# Patient Record
Sex: Male | Born: 1975 | Race: White | Hispanic: No | Marital: Single
Health system: Southern US, Community
[De-identification: ages and names within clinical notes are randomized; demographics above are authoritative.]

---

## 1998-01-01 ENCOUNTER — Emergency Department (HOSPITAL_COMMUNITY): Admission: EM | Admit: 1998-01-01 | Discharge: 1998-01-01 | Payer: Self-pay | Admitting: Emergency Medicine

## 2020-07-24 ENCOUNTER — Other Ambulatory Visit: Payer: Self-pay | Admitting: Family Medicine

## 2020-07-24 DIAGNOSIS — M5459 Other low back pain: Secondary | ICD-10-CM

## 2020-08-01 ENCOUNTER — Ambulatory Visit
Admission: RE | Admit: 2020-08-01 | Discharge: 2020-08-01 | Disposition: A | Discharge status: Home / Self Care | Payer: No Typology Code available for payment source | Source: Ambulatory Visit | Attending: Family Medicine | Admitting: Family Medicine

## 2020-08-01 ENCOUNTER — Other Ambulatory Visit: Payer: Self-pay

## 2020-08-01 DIAGNOSIS — M5459 Other low back pain: Secondary | ICD-10-CM | POA: Insufficient documentation

## 2020-09-14 ENCOUNTER — Encounter: Payer: Self-pay | Admitting: Physical Therapy

## 2020-09-14 ENCOUNTER — Other Ambulatory Visit: Payer: Self-pay

## 2020-09-14 ENCOUNTER — Ambulatory Visit: Payer: No Typology Code available for payment source | Attending: Neurosurgery | Admitting: Physical Therapy

## 2020-09-14 DIAGNOSIS — M542 Cervicalgia: Secondary | ICD-10-CM | POA: Diagnosis not present

## 2020-09-14 DIAGNOSIS — R293 Abnormal posture: Secondary | ICD-10-CM | POA: Insufficient documentation

## 2020-09-14 DIAGNOSIS — M62838 Other muscle spasm: Secondary | ICD-10-CM | POA: Insufficient documentation

## 2020-09-14 NOTE — Therapy (Addendum)
[facility] Outpatient Rehabilitation [facility] [address] [place], [place], [address] Phone: [phone]   Fax:  [phone]  Physical Therapy Evaluation  Patient Details  Name: Trevor Monroe MRN: [id number] Date of Birth: [birth year] Referring Provider (PT): Dr Barbaraann Barthel   Encounter Date: 09/14/2020   PT End of Session - 09/14/20 1640    Visit Number 1    Number of Visits 16    Authorization Type 15 visits + eval approved    PT Start Time 1145    PT Stop Time 1237    PT Time Calculation (min) 52 min    Activity Tolerance Patient tolerated treatment well    Behavior During Therapy [facility] for tasks assessed/performed           History reviewed. No pertinent past medical history.  History reviewed. No pertinent surgical history.  There were no vitals filed for this visit.    Subjective Assessment - 09/14/20 1211    Subjective For the past 8 months the patient has had progressive neck pain. The pain can last a few days and when it is hurting the pain can be severe. 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 His lowq back was injured in the [employer].    Pertinent History Per patient L4 disc buldge    Limitations Standing;Walking    Currently in Pain? Yes    Pain Score 8     Pain Location Neck    Pain Orientation Right;Left    Pain Descriptors / Indicators Aching    Pain Type Chronic pain    Pain Radiating Towards radiates into right arm but has been hapening less    Pain Onset More than a month ago    Pain Frequency Intermittent    Aggravating Factors  turning his head, sleeping, work    Pain Relieving Factors nothing when it is hurting    Effect of Pain on Daily Activities difficulty perfroming daily activity when his neck is hurting    Multiple Pain Sites No              [facility] PT Assessment - 09/14/20 0001      Assessment   Medical Diagnosis Cervicalgia    Referring Provider (PT)  Dr Barbaraann Barthel    Onset Date/Surgical Date --   >8 months   Hand Dominance Right    Next MD Visit 3-4 weeks    Prior Therapy Has been to a chiropracter and is recieving acupunture      Precautions   Precautions None      Restrictions   Weight Bearing Restrictions No      Balance Screen   Has the patient fallen in the past 6 months No    Has the patient had a decrease in activity level because of a fear of falling?  No    Is the patient reluctant to leave their home because of a fear of falling?  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 60% disability 2nd to lumbar buldging disc    Focus on Therapeutic Outcomes (FOTO)  49% ability 61 % expected      Sensation   Light Touch Appears Intact      Coordination   Gross Motor Movements are Fluid and Coordinated Yes    Fine Motor Movements are Fluid and Coordinated Yes      Posture/Postural Control   Posture Comments forward head      ROM / Strength   AROM / PROM / Strength PROM;AROM;Strength      AROM   Overall AROM Comments full active ROM of both shoulders    AROM Assessment Site Cervical    Cervical Flexion 38   with pain   Cervical Extension full range with mild pain    Cervical - Right Rotation 40    Cervical - Left Rotation 55      Strength   Overall Strength Comments right grip 115 left 120 all other UE strength 5/5      Palpation   Palpation comment Spasming of bilateral paraspinals R> LK                      Objective measurements completed on examination: See above  findings.       [facility] Adult PT Treatment/Exercise - 09/14/20 0001      Exercises   Exercises Neck      Neck Exercises: Standing   Other Standing Exercises scap retraction 2x10 green      Manual Therapy   Manual Therapy Myofascial release;Soft tissue mobilization;Joint mobilization;Manual Traction    Soft tissue mobilization to upper trap and cerivcal paraspinals    Manual Traction to cervical spine      Neck Exercises: Stretches   Upper Trapezius Stretch 2 reps;20 seconds    Upper Trapezius Stretch Limitations left only    Levator Stretch 2 reps;20 seconds    Levator Stretch Limitations left only                  PT Education - 09/14/20 1640    Education Details improtance of posture; decreasing muslce spasms    Person(s) Educated Patient    Methods Explanation;Demonstration;Tactile cues;Verbal cues    Comprehension Verbalized understanding;Returned demonstration;Verbal cues required;Tactile cues required;Need further instruction               PT SHORT TERM GOAL #1   Title Patient will report a 50% reduction in radicular pain    Time 3    Period Weeks    Status New    Target Date 10/22/20        PT SHORT TERM GOAL #2   Title Patient will demonstrate full cerivcal motion without pain    Time 3    Period Weeks    Status New    Target Date 10/22/20        PT SHORT TERM GOAL #3   Title Patient will report a good understanding of proper posture when perfroming work tasks    Time 3    Period Weeks    Status New    Target Date 10/22/20                 PT Long Term Goals - 10/01/20 0828         PT LONG TERM GOAL #1    Title Patient will perfrom all work tasks without increased  pain or radicular symptoms     Time 6     Period Weeks     Status New     Target Date 11/12/20          PT LONG TERM GOAL #2    Title Patient will return to working out without increased pain     Time 6     Period Weeks     Status  New     Target Date 11/12/20                    Plan - 09/14/20 1357    Clinical Impression Statement Patient is a 45 year old male with cervicalgia/ occasional right radiculopathy. He has limited movement with rotation R>L and pain with cervical flexion. Fro his job he has to look down and use a computer during the day. He has significant spasming in bilateral upper traps and into his neck.    Personal Factors and Comorbidities Comorbidity 1;Profession    Comorbidities lumbar bulding disc.    Examination-Activity Limitations Sleep;Lift;Carry    Examination-Participation Restrictions Cleaning;Meal Prep;Occupation;Laundry    Stability/Clinical Decision Making Evolving/Moderate complexity   increasing pain levels over the past 8 months   Clinical Decision Making Moderate    Rehab Potential Excellent    PT Frequency 2x / week    PT Duration 6 weeks    PT Treatment/Interventions ADLs/Self Care Home Management;Electrical Stimulation;Iontophoresis 4mg /ml Dexamethasone;Moist Heat;Ultrasound;Therapeutic activities;Therapeutic exercise;Neuromuscular re-education;Manual techniques;Patient/family education;Passive range of motion;Taping    PT Next Visit Plan continue with manual traction; dry needling; monitor right rotation; add shoulder extnesion; consider bilateral ER; manual therapy to thoriacic spine if indicated.    PT Home Exercise Plan upper trap stretch; levatro stretch; row; cervical rotation 3x 3x a day    Consulted and Agree with Plan of Care Patient           Patient will benefit from skilled therapeutic intervention in order to improve the following deficits and impairments:  Increased fascial restricitons,Decreased endurance,Pain,Decreased mobility,Decreased strength,Improper body mechanics,Decreased activity tolerance,Increased muscle spasms  Visit Diagnosis: Cervicalgia  Abnormal posture  Other muscle spasm     Problem List There are no problems to display for  this patient.    PT DPT  09/14/2020, 4:48 PM  [facility] [address] [place], [place], [place] Phone: [phone]   Fax:  [phone]  Name: Trevor Monroe MRN: [name] Date of Birth: July 02, 1975

## 2020-09-28 ENCOUNTER — Other Ambulatory Visit: Payer: Self-pay

## 2020-09-28 ENCOUNTER — Encounter: Payer: Self-pay | Admitting: Physical Therapy

## 2020-09-28 ENCOUNTER — Ambulatory Visit: Payer: No Typology Code available for payment source | Admitting: Physical Therapy

## 2020-09-28 DIAGNOSIS — R293 Abnormal posture: Secondary | ICD-10-CM

## 2020-09-28 DIAGNOSIS — M542 Cervicalgia: Secondary | ICD-10-CM

## 2020-09-28 DIAGNOSIS — M62838 Other muscle spasm: Secondary | ICD-10-CM

## 2020-09-28 NOTE — Therapy (Signed)
[facility] Outpatient Rehabilitation [facility] [address] [place], [place], [address] Phone: [phone]   Fax:  [phone]  Physical Therapy Treatment  Patient Details  Name: Trevor Monroe MRN: [id number] Date of Birth: 1976/02/10 Referring Provider (PT): Dr Barbaraann Barthel   Encounter Date: 09/28/2020   PT End of Session - 09/28/20 1017    Visit Number 2    Number of Visits 16    Date for PT Re-Evaluation 11/02/20    Authorization Type 15 visits + eval approved    Authorization Time Period 08/11/2020 - 02/07/2021    Authorization - Visit Number 2    Authorization - Number of Visits 16    PT Start Time 1017    PT Stop Time 1058    PT Time Calculation (min) 41 min    Activity Tolerance Patient tolerated treatment well    Behavior During Therapy [facility] for tasks assessed/performed           History reviewed. No pertinent past medical history.  History reviewed. No pertinent surgical history.  There were no vitals filed for this visit.   Subjective Assessment - 09/28/20 1018    Subjective "I am doing pretty good since the last session. I have been doing my exercsises and I am still doing accupuncture all over. I am have more L flank pain and still having issues with HA."    Currently in Pain? Yes    Pain Score 4     Pain Location Neck              [facility] PT Assessment - 09/28/20 0001      Assessment   Medical Diagnosis Cervicalgia                         [facility] Adult PT Treatment/Exercise - 09/28/20 0001      Neck Exercises: Theraband   Other Theraband Exercises seated lower trap strenthening 2 x 15 with red theraband with elbows propped on bolster    Other Theraband Exercises 1 x 20 double ER with scapular retraction 1 x 20      Manual Therapy   Manual Therapy Myofascial release    Manual therapy comments skilled palpation and monitoring of pt throughout TPDN    Soft tissue mobilization IASTM along R upper trap / cervical  multidif    Myofascial Release fascial rolling      Neck Exercises: Stretches   Upper Trapezius Stretch 1 rep;Right;30 seconds    Levator Stretch 1 rep;30 seconds            Trigger Point Dry Needling - 09/28/20 0001    Consent Given? Yes    Education Handout Provided Previously provided    Muscles Treated Head and Neck Cervical multifidi;Upper trapezius    Upper Trapezius Response Twitch reponse elicited;Palpable increased muscle length   bil   Cervical multifidi Response Twitch reponse elicited;Palpable increased muscle length   bil                           Plan - 09/28/20 1106    Clinical Impression Statement pt arrives noting significant reduction in neck pain. He has been compliant with his HEP and has been going to an accupunturist. reviewed TPDN and consent was provided for TPDN focusing on bil upper trap, cervical multifidi followed with IASTM techniques. continued working posterior shoulder strengthening which he responded well to. trialed upper trap inhibition taping  which he responded well to.    PT Treatment/Interventions ADLs/Self Care Home Management;Electrical Stimulation;Iontophoresis 4mg /ml Dexamethasone;Moist Heat;Ultrasound;Therapeutic activities;Therapeutic exercise;Neuromuscular re-education;Manual techniques;Patient/family education;Passive range of motion;Taping    PT Next Visit Plan continue with manual traction; response dry needling; monitor right rotation; add shoulder extnesion; consider bilateral ER; manual therapy to thoriacic spine if indicated.    PT Home Exercise Plan upper trap stretch; levatro stretch; row; cervical rotation 3x 3x a day    Consulted and Agree with Plan of Care Patient           Patient will benefit from skilled therapeutic intervention in order to improve the following deficits and impairments:  Increased fascial restricitons,Decreased endurance,Pain,Decreased mobility,Decreased strength,Improper body  mechanics,Decreased activity tolerance,Increased muscle spasms  Visit Diagnosis: Cervicalgia  Abnormal posture  Other muscle spasm     Problem List There are no problems to display for this patient.   PT, DPT, LAT, ATC  09/28/20  11:09 AM      [facility] [address] [place], [place], [place] Phone: [phone]   Fax:  [phone]  Name: Trevor Monroe MRN: [name] Date of Birth: [birth year]

## 2020-09-30 ENCOUNTER — Ambulatory Visit: Payer: No Typology Code available for payment source | Admitting: Physical Therapy

## 2020-09-30 ENCOUNTER — Other Ambulatory Visit: Payer: Self-pay

## 2020-09-30 ENCOUNTER — Encounter: Payer: Self-pay | Admitting: Physical Therapy

## 2020-09-30 DIAGNOSIS — M542 Cervicalgia: Secondary | ICD-10-CM

## 2020-09-30 DIAGNOSIS — M62838 Other muscle spasm: Secondary | ICD-10-CM

## 2020-09-30 DIAGNOSIS — R293 Abnormal posture: Secondary | ICD-10-CM

## 2020-10-01 ENCOUNTER — Encounter: Payer: Self-pay | Admitting: Physical Therapy

## 2020-10-01 NOTE — Therapy (Signed)
[facility] Out[facility] Rehabilitation [facility] [address] [place], [place], [address] Phone: [phone]   Fax:  [phone]  Physical Therapy Treatment  Patient Details  Name: Trevor Monroe MRN: [id number] Date of Birth: 04-22-1976 Referring Provider (PT): Dr Barbaraann Barthel   Encounter Date: 09/30/2020   PT End of Session - 10/01/20 0825    Visit Number 3    Number of Visits 16    Date for PT Re-Evaluation 11/02/20    Authorization Type 15 visits + eval approved    Authorization Time Period 08/11/2020 - 02/07/2021    Authorization - Visit Number 3    Authorization - Number of Visits 16    PT Start Time 1016    PT Stop Time 1059    PT Time Calculation (min) 43 min    Activity Tolerance Patient tolerated treatment well    Behavior During Therapy [facility] for tasks assessed/performed           History reviewed. No pertinent past medical history.  History reviewed. No pertinent surgical history.  There were no vitals filed for this visit.   Subjective Assessment - 10/01/20 0823    Subjective Patient reports his neck pain is improving. he is stil having mild radicular symptoms into his right deltoid area at times. He also continues to have mild left saided low back pain. He has been working on his exercises. he reported the needling went well.    Pertinent History Per patient L4 disc buldge    Limitations Standing;Walking    Currently in Pain? Yes    Pain Score 4     Pain Location Neck    Pain Orientation Right;Left    Pain Descriptors / Indicators Aching    Pain Type Chronic pain    Pain Onset More than a month ago    Pain Frequency Intermittent    Aggravating Factors  turning his head    Pain Relieving Factors ntohing wehn it is hurting    Effect of Pain on Daily Activities difficulty perfroming daily activity    Multiple Pain Sites No              [facility] PT Assessment - 10/01/20 0001      Assessment   Referring Provider (PT) Dr Barbaraann Barthel                          [facility] Adult PT Treatment/Exercise - 10/01/20 0001      Neck Exercises: Theraband   Other Theraband Exercises seated lower trap strenthening 2 x 15 with red theraband with elbows propped on bolster    Other Theraband Exercises 1 x 20 double ER with scapular retraction 1 x 20      Neck Exercises: Standing   Other Standing Exercises scap retraction 2x10 blue ; shoulder extnesion blue      Manual Therapy   Manual Therapy Myofascial release    Manual therapy comments skilled palpation and monitoring of pt throughout TPDN    Soft tissue mobilization IASTM along R upper trap / cervical multidif    Manual Traction to cervical spine      Neck Exercises: Stretches   Upper Trapezius Stretch 1 rep;Right;30 seconds    Levator Stretch 1 rep;30 seconds                  PT Education - 10/01/20 0824    Education Details HEP and symptom mangement    Person(s) Educated Patient  Methods Explanation;Demonstration;Tactile cues;Verbal cues    Comprehension Verbalized understanding;Returned demonstration;Verbal cues required;Tactile cues required            PT Short Term Goals - 10/01/20 0827      PT SHORT TERM GOAL #1   Title Patient will report a 50% reduction in radicular pain    Time 3    Period Weeks    Status New    Target Date 10/22/20      PT SHORT TERM GOAL #2   Title Patient will demonstrate full cerivcal motion without pain    Time 3    Period Weeks    Status New    Target Date 10/22/20      PT SHORT TERM GOAL #3   Title Patient will report a good understanding of proper posture when perfroming work tasks    Time 3    Period Weeks    Status New    Target Date 10/22/20             PT Long Term Goals - 10/01/20 0828      PT LONG TERM GOAL #1   Title Patient will perfrom all work tasks without increased pain or radicular symptoms    Time 6    Period Weeks    Status New    Target Date 11/12/20      PT LONG TERM GOAL #2    Title Patient will return to working out without increased pain    Time 6    Period Weeks    Status New    Target Date 11/12/20                 Plan - 10/01/20 0825    Clinical Impression Statement Patient is making good progress. therapy reviewed HEP and perfromed manual therapy to the cervicalspine. he had no increase in pain with treatment. We will continue with current plan. Plan to needle next visit. Patient advised to continue to be aware of his posture.    Personal Factors and Comorbidities Comorbidity 1;Profession    Comorbidities lumbar bulding disc.    Examination-Activity Limitations Sleep;Lift;Carry    Examination-Participation Restrictions Cleaning;Meal Prep;Occupation;Laundry    Stability/Clinical Decision Making Evolving/Moderate complexity    Clinical Decision Making Moderate    Rehab Potential Excellent    PT Frequency 2x / week    PT Duration 6 weeks    PT Treatment/Interventions ADLs/Self Care Home Management;Electrical Stimulation;Iontophoresis 4mg /ml Dexamethasone;Moist Heat;Ultrasound;Therapeutic activities;Therapeutic exercise;Neuromuscular re-education;Manual techniques;Patient/family education;Passive range of motion;Taping    PT Next Visit Plan continue with manual traction; response dry needling; monitor right rotation; add shoulder extnesion; consider bilateral ER; manual therapy to thoriacic spine if indicated.    PT Home Exercise Plan upper trap stretch; levatro stretch; row; cervical rotation 3x 3x a day    Consulted and Agree with Plan of Care Patient           Patient will benefit from skilled therapeutic intervention in order to improve the following deficits and impairments:  Increased fascial restricitons,Decreased endurance,Pain,Decreased mobility,Decreased strength,Improper body mechanics,Decreased activity tolerance,Increased muscle spasms  Visit Diagnosis: Cervicalgia  Abnormal posture  Other muscle spasm     Problem List There  are no problems to display for this patient.   PT DPT  10/01/2020, 8:40 AM  [facility] [address] [place], [place], [place] Phone: [phone]   Fax:  [phone]  Name: Trevor Monroe MRN: [name] Date of Birth: January 24, 1976

## 2020-10-01 NOTE — Addendum Note (Signed)
Addended by: Dessie Coma on: 10/01/2020 08:54 AM   Modules accepted: Orders

## 2020-10-07 ENCOUNTER — Encounter: Payer: Self-pay | Admitting: Physical Therapy

## 2020-10-07 ENCOUNTER — Other Ambulatory Visit: Payer: Self-pay

## 2020-10-07 ENCOUNTER — Ambulatory Visit: Payer: No Typology Code available for payment source | Admitting: Physical Therapy

## 2020-10-07 DIAGNOSIS — M62838 Other muscle spasm: Secondary | ICD-10-CM

## 2020-10-07 DIAGNOSIS — M542 Cervicalgia: Secondary | ICD-10-CM | POA: Diagnosis not present

## 2020-10-07 DIAGNOSIS — R293 Abnormal posture: Secondary | ICD-10-CM

## 2020-10-08 NOTE — Therapy (Signed)
[facility] Outpatient Rehabilitation [facility] [address] [place], [place], [address] Phone: [phone]   Fax:  [phone]  Physical Therapy Treatment  Patient Details  Name: Trevor Monroe MRN: [id number] Date of Birth: 10-02-1975 Referring Provider (PT): Dr Barbaraann Barthel   Encounter Date: 10/07/2020   PT End of Session - 10/07/20 0848    Visit Number 4    Number of Visits 16    Date for PT Re-Evaluation 11/02/20    Authorization Type 15 visits + eval approved    Authorization Time Period 08/11/2020 - 02/07/2021    Authorization - Visit Number 4    Authorization - Number of Visits 16    PT Start Time 0848    PT Stop Time 0928    PT Time Calculation (min) 40 min    Activity Tolerance Patient tolerated treatment well    Behavior During Therapy [facility] for tasks assessed/performed           History reviewed. No pertinent past medical history.  History reviewed. No pertinent surgical history.  There were no vitals filed for this visit.   Subjective Assessment - 10/07/20 0849    Subjective "I think things are getting better, I am feeling pretty good today. I had a follow up with the spine MD the other day and he ordered a MRI for my lower spine."    Currently in Pain? No/denies    Pain Score 0-No pain    Aggravating Factors  unsure                             [facility] Adult PT Treatment/Exercise - 10/07/20 0001      Exercises   Exercises Shoulder      Neck Exercises: Supine   Neck Retraction 5 reps;10 secs    Capital Flexion 10 secs    Capital Flexion Limitations chin tuck with head lift      Shoulder Exercises: Supine   Theraband Level (Shoulder Horizontal ABduction) Level 3 (Green)    Horizontal ABduction Limitations 2x10    Other Supine Exercises D2 pattern with GTB 2x10 ea      Shoulder Exercises: Prone   Horizontal ABduction 2 10 reps;Strengthening   on physioball shoulder 90-90  w/ chin tuck 5/ holds 2 sets     Shoulder  Exercises: Standing   External Rotation 15 reps;Strengthening;Both   3 sets green TB   Other Standing Exercises Self held Tricep ext   Green TB  20x ea     Shoulder Exercises: ROM/Strengthening   UBE (Upper Arm Bike) level 5 2' fwd/bkwd                  PT Education - 10/07/20 0950    Education Details Activity modification to avoid aggs    Person(s) Educated Patient    Methods Explanation;Verbal cues    Comprehension Verbalized understanding            PT Short Term Goals - 10/01/20 0827      PT SHORT TERM GOAL #1   Title Patient will report a 50% reduction in radicular pain    Time 3    Period Weeks    Status New    Target Date 10/22/20      PT SHORT TERM GOAL #2   Title Patient will demonstrate full cerivcal motion without pain    Time 3    Period Weeks    Status New  Target Date 10/22/20      PT SHORT TERM GOAL #3   Title Patient will report a good understanding of proper posture when perfroming work tasks    Time 3    Period Weeks    Status New    Target Date 10/22/20             PT Long Term Goals - 10/01/20 0828      PT LONG TERM GOAL #1   Title Patient will perfrom all work tasks without increased pain or radicular symptoms    Time 6    Period Weeks    Status New    Target Date 11/12/20      PT LONG TERM GOAL #2   Title Patient will return to working out without increased pain    Time 6    Period Weeks    Status New    Target Date 11/12/20                 Plan - 10/07/20 0944    Clinical Impression Statement Pt is progressing well with therapy.  He is still having some intermittent pain but feels he is improving overall.  Pt advised to take breaks during strenuous activity at work; he confirms understanding.  Pt tolerated therex well with fatigue but no increase in sxs.    PT Treatment/Interventions ADLs/Self Care Home Management;Electrical Stimulation;Iontophoresis 4mg /ml Dexamethasone;Moist Heat;Ultrasound;Therapeutic  activities;Therapeutic exercise;Neuromuscular re-education;Manual techniques;Patient/family education;Passive range of motion;Taping    PT Next Visit Plan continue with manual traction; response dry needling; monitor right rotation; add shoulder extnesion; consider bilateral ER; manual therapy to thoriacic spine if indicated.    PT Home Exercise Plan upper trap stretch; levatro stretch; row; cervical rotation 3x 3x a day           Patient will benefit from skilled therapeutic intervention in order to improve the following deficits and impairments:     Visit Diagnosis: No diagnosis found.     Problem List There are no problems to display for this patient.   PT, DPT, LAT, ATC  10/08/20  7:54 AM      [facility] [address] [place], [place], [place] Phone: [phone]   Fax:  [phone]  Name: Trevor Monroe MRN: [name] Date of Birth: 09/12/1975

## 2020-10-14 ENCOUNTER — Other Ambulatory Visit: Payer: Self-pay

## 2020-10-14 ENCOUNTER — Ambulatory Visit: Payer: No Typology Code available for payment source | Attending: Neurosurgery | Admitting: Physical Therapy

## 2020-10-14 DIAGNOSIS — R293 Abnormal posture: Secondary | ICD-10-CM | POA: Diagnosis present

## 2020-10-14 DIAGNOSIS — M62838 Other muscle spasm: Secondary | ICD-10-CM | POA: Insufficient documentation

## 2020-10-14 DIAGNOSIS — M542 Cervicalgia: Secondary | ICD-10-CM | POA: Diagnosis not present

## 2020-10-14 NOTE — Therapy (Signed)
[facility] [address] [place], [place], [address] Phone: [phone]   Fax:  [phone]  Physical Therapy Treatment  Patient Details  Name: Trevor Monroe MRN: [id number] Date of Birth: Oct 28, 1975 Referring Provider (PT): Dr Barbaraann Barthel   Encounter Date: 10/14/2020   PT End of Session - 10/14/20 1048    Visit Number 5    Number of Visits 16    Date for PT Re-Evaluation 11/02/20    Authorization Type 15 visits + eval approved    Authorization Time Period 08/11/2020 - 02/07/2021    Authorization - Visit Number 4    Authorization - Number of Visits 16    PT Start Time 1045    PT Stop Time 1130    PT Time Calculation (min) 45 min    Activity Tolerance Patient tolerated treatment well    Behavior During Therapy [facility] for tasks assessed/performed           No past medical history on file.  No past surgical history on file.  There were no vitals filed for this visit.   Subjective Assessment - 10/14/20 1048    Subjective Pt reports that he has had some intermittent n/t in his fingers L>R (2x since last visit).  His neck has been doing well, although he endorses some LBP today.    Pertinent History Per patient L4 disc buldge    Currently in Pain? Yes    Pain Score 3     Pain Location Back                             [facility] Adult PT Treatment/Exercise - 10/14/20 0001      Neck Exercises: Machines for Strengthening   Cybex Row high row - 3x10 - 36#      Neck Exercises: Supine   Neck Retraction 5 reps;10 secs    Neck Retraction Limitations with manual OP, L/R/C for listed reps each    Capital Flexion 10 secs    Capital Flexion Limitations chin tuck with head lift      Shoulder Exercises: Supine   Theraband Level (Shoulder Horizontal ABduction) Level 4 (Blue)    Horizontal ABduction Limitations 3x10    Other Supine Exercises D2 pattern with GTB 2x10 ea    Other Supine Exercises Foam roller routine  incuding protraction/retraction, horizontal abduction, alternating flexion, and shoulder circles 5x ea      Shoulder Exercises: Prone   Other Prone Exercises Bird dog - 2x10 with 2 pound weights on wrists    Other Prone Exercises Scapular push up from knees - 2x10 with 3'' hold                    PT Short Term Goals - 10/01/20 0827      PT SHORT TERM GOAL #1   Title Patient will report a 50% reduction in radicular pain    Time 3    Period Weeks    Status New    Target Date 10/22/20      PT SHORT TERM GOAL #2   Title Patient will demonstrate full cerivcal motion without pain    Time 3    Period Weeks    Status New    Target Date 10/22/20      PT SHORT TERM GOAL #3   Title Patient will report a good understanding of proper posture when perfroming work tasks    Time  3    Period Weeks    Status New    Target Date 10/22/20             PT Long Term Goals - 10/01/20 0828      PT LONG TERM GOAL #1   Title Patient will perfrom all work tasks without increased pain or radicular symptoms    Time 6    Period Weeks    Status New    Target Date 11/12/20      PT LONG TERM GOAL #2   Title Patient will return to working out without increased pain    Time 6    Period Weeks    Status New    Target Date 11/12/20                 Plan - 10/14/20 1103    Clinical Impression Statement Pt is progressing well with therapy.  Able to progress to higher level periscapular strengthening today with fatigue but no increase in sxs.  Will integrate thoracic mobility followed by strengthening in furture visits.    PT Treatment/Interventions ADLs/Self Care Home Management;Electrical Stimulation;Iontophoresis 4mg /ml Dexamethasone;Moist Heat;Ultrasound;Therapeutic activities;Therapeutic exercise;Neuromuscular re-education;Manual techniques;Patient/family education;Passive range of motion;Taping    PT Next Visit Plan continue with manual traction; response dry needling; monitor  right rotation; add shoulder extnesion; consider bilateral ER; manual therapy to thoriacic spine if indicated.    PT Home Exercise Plan bilateral ER, D2 flexion, and foam roller           Patient will benefit from skilled therapeutic intervention in order to improve the following deficits and impairments:     Visit Diagnosis: Cervicalgia  Abnormal posture     Problem List There are no problems to display for this patient.   PT, DPT 10/14/20 11:33 AM  [facility] [address] [place], [place], [place] Phone: [phone]   Fax:  [phone]  Name: OSMEL DYKSTRA MRN: [name] Date of Birth: [birth year]

## 2020-10-21 ENCOUNTER — Encounter: Payer: Self-pay | Admitting: Physical Therapy

## 2020-10-21 ENCOUNTER — Other Ambulatory Visit: Payer: Self-pay

## 2020-10-21 ENCOUNTER — Ambulatory Visit: Payer: No Typology Code available for payment source | Admitting: Physical Therapy

## 2020-10-21 DIAGNOSIS — M542 Cervicalgia: Secondary | ICD-10-CM

## 2020-10-21 DIAGNOSIS — M62838 Other muscle spasm: Secondary | ICD-10-CM

## 2020-10-21 DIAGNOSIS — R293 Abnormal posture: Secondary | ICD-10-CM

## 2020-10-21 NOTE — Therapy (Signed)
[facility] [address] [place], [place], [address] Phone: [phone]   Fax:  [phone]  Physical Therapy Treatment  Patient Details  Name: Trevor Monroe MRN: [id number] Date of Birth: 1975-12-23 Referring Provider (PT): Dr Barbaraann Barthel   Encounter Date: 10/21/2020   PT End of Session - 10/21/20 1016    Visit Number 6    Number of Visits 16    Date for PT Re-Evaluation 11/02/20    Authorization Type 15 visits + eval approved    Authorization Time Period 08/11/2020 - 02/07/2021    Authorization - Number of Visits 16    PT Start Time 1015   pt arrived late   Activity Tolerance Patient tolerated treatment well    Behavior During Therapy [facility] for tasks assessed/performed           History reviewed. No pertinent past medical history.  History reviewed. No pertinent surgical history.  There were no vitals filed for this visit.   Subjective Assessment - 10/21/20 1017    Subjective Pt reports that his neck is doing well.  he has been consistent with his HEP.  He feels that HEP is still challenging.  He has had no flare ups since last visit.    Pertinent History Per patient L4 disc buldge    Pain Score 0-No pain                             [facility] Adult PT Treatment/Exercise - 10/21/20 0001      Neck Exercises: Machines for Strengthening   Cybex Row high row - 3x15 - 25#      Shoulder Exercises: Supine   Theraband Level (Shoulder Horizontal ABduction) Level 4 (Blue)    Horizontal ABduction Limitations 2x10 on foam roller    Other Supine Exercises D2 pattern with BTB 2x10 ea on foam roller    Other Supine Exercises Foam roller routine incuding protraction/retraction, horizontal abduction, alternating flexion, and shoulder circles, thoracic ext, and throacic rotation in quadruped 5x ea      Shoulder Exercises: Prone   External Rotation Limitations at 90-90 on physioball w/ 2#    Other Prone Exercises Bird  dog - 2x10 with 2 pound weights on wrists - 5'' hold                    PT Short Term Goals - 10/01/20 0827      PT SHORT TERM GOAL #1   Title Patient will report a 50% reduction in radicular pain    Time 3    Period Weeks    Status New    Target Date 10/22/20      PT SHORT TERM GOAL #2   Title Patient will demonstrate full cerivcal motion without pain    Time 3    Period Weeks    Status New    Target Date 10/22/20      PT SHORT TERM GOAL #3   Title Patient will report a good understanding of proper posture when perfroming work tasks    Time 3    Period Weeks    Status New    Target Date 10/22/20             PT Long Term Goals - 10/01/20 0828      PT LONG TERM GOAL #1   Title Patient will perfrom all work tasks without increased pain or radicular symptoms  Time 6    Period Weeks    Status New    Target Date 11/12/20      PT LONG TERM GOAL #2   Title Patient will return to working out without increased pain    Time 6    Period Weeks    Status New    Target Date 11/12/20                 Plan - 10/21/20 1025    Clinical Impression Statement Pt progressing as expected.  Will continue to progress cervical and periscapular strengthening combined with throacic ext.  Progressed supine periscapular execises to perform on foam roller to encourage throacic ext and core activation.  Pt cued for form with high row with good reponse.    PT Frequency 2x / week    PT Duration 6 weeks    PT Treatment/Interventions ADLs/Self Care Home Management;Electrical Stimulation;Iontophoresis 4mg /ml Dexamethasone;Moist Heat;Ultrasound;Therapeutic activities;Therapeutic exercise;Neuromuscular re-education;Manual techniques;Patient/family education;Passive range of motion;Taping    PT Next Visit Plan monitor right rotation    PT Home Exercise Plan bilateral ER, D2 flexion, and foam roller           Patient will benefit from skilled therapeutic intervention in order  to improve the following deficits and impairments:     Visit Diagnosis: Cervicalgia  Abnormal posture  Other muscle spasm     Problem List There are no problems to display for this patient.   PT, DPT 10/21/20 10:46 AM  [facility] [address] [place], [place], [place] Phone: [phone]   Fax:  [phone]  Name: GYASI HAZZARD MRN: [name] Date of Birth: [birth year]

## 2020-10-28 ENCOUNTER — Encounter: Payer: Self-pay | Admitting: Physical Therapy

## 2020-10-28 ENCOUNTER — Ambulatory Visit: Payer: No Typology Code available for payment source | Admitting: Physical Therapy

## 2020-10-28 ENCOUNTER — Other Ambulatory Visit: Payer: Self-pay

## 2020-10-28 DIAGNOSIS — R293 Abnormal posture: Secondary | ICD-10-CM

## 2020-10-28 DIAGNOSIS — M62838 Other muscle spasm: Secondary | ICD-10-CM

## 2020-10-28 DIAGNOSIS — M542 Cervicalgia: Secondary | ICD-10-CM

## 2020-10-28 NOTE — Therapy (Signed)
[facility] Out[facility] Rehabilitation [facility] [address] [place], [place], [address] Phone: [phone]   Fax:  [phone]  Physical Therapy Treatment  Patient Details  Name: Trevor Monroe MRN: [id number] Date of Birth: 1976/03/25 Referring Provider (PT): Dr Barbaraann Barthel   Encounter Date: 10/28/2020   PT End of Session - 10/28/20 1006    Visit Number 7    Number of Visits 16    Date for PT Re-Evaluation 11/02/20    Authorization Type 15 visits + eval approved    Authorization Time Period 08/11/2020 - 02/07/2021    Authorization - Number of Visits 16    PT Start Time 1005    PT Stop Time 1045    PT Time Calculation (min) 40 min    Activity Tolerance Patient tolerated treatment well    Behavior During Therapy [facility] for tasks assessed/performed           History reviewed. No pertinent past medical history.  History reviewed. No pertinent surgical history.  There were no vitals filed for this visit.   Subjective Assessment - 10/28/20 1006    Subjective Pt reports that his neck has been doing well.  He has had no significant issues since last visit.  he has been HEP compliant    Pertinent History Per patient L4 disc buldge    Currently in Pain? No/denies                             [facility] Adult PT Treatment/Exercise - 10/28/20 0001      Neck Exercises: Machines for Strengthening   Cybex Row high row - 5x4 @ 45#      Neck Exercises: Seated   Other Seated Exercise seated cervical snag - 2x15      Neck Exercises: Prone   Other Prone Exercise prone pec stretch 30''x3      Shoulder Exercises: Supine   Horizontal ABduction Limitations 3x10 on foam roller    Diagonals Limitations D2 pattern with BTB 2x10 ea on foam roller    Other Supine Exercises supine tabletop bridge 2x10    Other Supine Exercises biceps strech 2x45''      Shoulder Exercises: Seated   Other Seated Exercises tricep dip in chair 2x10 with concentration on scapular  depression      Shoulder Exercises: Prone   External Rotation Limitations at 90-90 on physioball w/ 2.5#    Other Prone Exercises Bird dog - 3x10 with 2.5 pound weights on wrists - 5'' hold                    PT Short Term Goals - 10/01/20 0827      PT SHORT TERM GOAL #1   Title Patient will report a 50% reduction in radicular pain    Time 3    Period Weeks    Status New    Target Date 10/22/20      PT SHORT TERM GOAL #2   Title Patient will demonstrate full cerivcal motion without pain    Time 3    Period Weeks    Status New    Target Date 10/22/20      PT SHORT TERM GOAL #3   Title Patient will report a good understanding of proper posture when perfroming work tasks    Time 3    Period Weeks    Status New    Target Date 10/22/20  PT Long Term Goals - 10/01/20 4098      PT LONG TERM GOAL #1   Title Patient will perfrom all work tasks without increased pain or radicular symptoms    Time 6    Period Weeks    Status New    Target Date 11/12/20      PT LONG TERM GOAL #2   Title Patient will return to working out without increased pain    Time 6    Period Weeks    Status New    Target Date 11/12/20                 Plan - 10/28/20 1016    Clinical Impression Statement Pt is progressing well with therapy.  We concentrated on shoulder and scapular mobility today.  We also added in a cervical snag to address cervical mobility deficits.  Pt tolerated well; we will increase intensity as appropriate.    PT Frequency 2x / week    PT Duration 6 weeks    PT Treatment/Interventions ADLs/Self Care Home Management;Electrical Stimulation;Iontophoresis 4mg /ml Dexamethasone;Moist Heat;Ultrasound;Therapeutic activities;Therapeutic exercise;Neuromuscular re-education;Manual techniques;Patient/family education;Passive range of motion;Taping    PT Next Visit Plan monitor right rotation    PT Home Exercise Plan bilateral ER, D2 flexion, and foam roller,  cx snag           Patient will benefit from skilled therapeutic intervention in order to improve the following deficits and impairments:     Visit Diagnosis: Cervicalgia  Abnormal posture  Other muscle spasm     Problem List There are no problems to display for this patient.   10/28/2020, 10:51 AM  [facility] [address] [place], [place], [place] Phone: [phone]   Fax:  [phone]  Name: NEVIN GRIZZLE MRN: [name] Date of Birth: January 24, 1976

## 2020-11-04 ENCOUNTER — Ambulatory Visit: Payer: No Typology Code available for payment source | Admitting: Physical Therapy

## 2020-11-18 ENCOUNTER — Ambulatory Visit: Payer: No Typology Code available for payment source | Attending: Neurosurgery | Admitting: Physical Therapy

## 2020-11-18 ENCOUNTER — Other Ambulatory Visit: Payer: Self-pay

## 2020-11-18 ENCOUNTER — Encounter: Payer: Self-pay | Admitting: Physical Therapy

## 2020-11-18 DIAGNOSIS — M542 Cervicalgia: Secondary | ICD-10-CM | POA: Diagnosis not present

## 2020-11-18 DIAGNOSIS — R293 Abnormal posture: Secondary | ICD-10-CM | POA: Insufficient documentation

## 2020-11-18 DIAGNOSIS — M62838 Other muscle spasm: Secondary | ICD-10-CM

## 2020-11-18 NOTE — Therapy (Signed)
[place], [place], [address] Phone: [phone]   Fax:  [phone]  Physical Therapy Treatment  Patient Details  Name: Trevor Monroe MRN: [id number] Date of Birth: [birth year] Referring Provider (PT): Dr Dayton Bailiff   Encounter Date: 11/18/2020   PT End of Session - 11/18/20 1049    Visit Number 8    Number of Visits 16    Date for PT Re-Evaluation 01/13/21    Authorization Type 15 visits + eval approved    Authorization Time Period 08/11/2020 - 02/07/2021    Authorization - Visit Number 8    Authorization - Number of Visits 16    PT Start Time 6945    PT Stop Time 1130    PT Time Calculation (min) 41 min    Activity Tolerance Patient tolerated treatment well    Behavior During Therapy [facility] for tasks assessed/performed           History reviewed. No pertinent past medical history.  History reviewed. No pertinent surgical history.  There were no vitals filed for this visit.   Subjective Assessment - 11/18/20 1050    Subjective Pt reports that he got COVID several weeks ago and was unable to attend his session. Since this time his pain has increased significantly.  He also d/c acupuncture for this time which he thinks may have contributed.  He is having significant R UE n/t and pain intermittently.    Pertinent History Per patient L4 disc buldge    Pain Score 7     Pain Location Neck    Pain Orientation Right    Pain Radiating Towards R arm              [facility] PT Assessment - 11/18/20 0001      AROM   Cervical Flexion 42 with pain    Cervical Extension 45    Cervical - Right Side Bend WNL w/ reproduction of radicular sxs    Cervical - Left Side Bend WNL    Cervical - Right Rotation 50    Cervical - Left Rotation 65                         [facility] Adult PT Treatment/Exercise - 11/18/20 0001      Therapeutic Activites    Therapeutic Activities Other Therapeutic Activities       Neck Exercises: Machines for Strengthening   UBE (Upper Arm Bike) 2.5' fwd/bkwd level 1      Neck Exercises: Seated   Other Seated Exercise seated cervical snag - 2x15 for rotation      Manual Therapy   Manual therapy comments sub occipital release    Manual Traction to cervical spine            Trigger Point Dry Needling - 11/18/20 0001    Consent Given? Yes    Education Handout Provided Previously provided    Muscles Treated Head and Neck Upper trapezius;Levator scapulae    Other Dry Needling TDN completed by certified PT    Upper Trapezius Response Twitch reponse elicited;Palpable increased muscle length   bil   Cervical multifidi Response Twitch reponse elicited;Palpable increased muscle length   bil                 PT Short Term Goals - 11/18/20 1839      PT SHORT TERM GOAL #1   Title Patient will report a 50% reduction in  radicular pain    Baseline no reduction today, was having minimal radicular pain before setback    Time 3    Period Weeks    Status New    Target Date 10/22/20      PT SHORT TERM GOAL #2   Title Patient will demonstrate full cerivcal motion without pain    Baseline Rotation improved    Time 3    Period Weeks    Status Partially Met    Target Date 10/22/20      PT SHORT TERM GOAL #3   Title Patient will report a good understanding of proper posture when perfroming work tasks    Time 3    Period Weeks    Status Achieved    Target Date 10/22/20             PT Long Term Goals - 11/18/20 1842      PT LONG TERM GOAL #1   Title Patient will perfrom all work tasks without increased pain or radicular symptoms    Time 6    Period Weeks    Status Revised    Target Date 01/13/21      PT LONG TERM GOAL #2   Title Patient will return to working out without increased pain    Time 6    Period Weeks    Status Revised    Target Date 01/13/21                 Plan - 11/18/20 1838    Clinical Impression Statement Pt was  progressing very well with therapy but has had a significant setback following missing several appts d/t COVID.  he does show improved cervical rotation compared to eval.  prior to COVID he was reporting almost no pain or limitations.  His sxs today are not a good representation of his overall progress. Given his past progress I am extending his POC for 8 weeks at 1x/week.    PT Frequency 1x / week    PT Duration 8 weeks    PT Treatment/Interventions ADLs/Self Care Home Management;Electrical Stimulation;Iontophoresis 50m/ml Dexamethasone;Moist Heat;Ultrasound;Therapeutic activities;Therapeutic exercise;Neuromuscular re-education;Manual techniques;Patient/family education;Passive range of motion;Taping    PT Next Visit Plan monitor right rotation    PT Home Exercise Plan D2 flexion, and foam roller, cx snag           Patient will benefit from skilled therapeutic intervention in order to improve the following deficits and impairments:     Visit Diagnosis: Cervicalgia  Abnormal posture  Other muscle spasm     Problem List There are no problems to display for this patient.   KShearon BaloPT, DPT 11/18/20 6:53 PM  [facility] [address]Phone: [phone]  Fax:  [phone] Name: Trevor VANHORNMRN: [id number] of Birth: [birth year]

## 2020-11-27 ENCOUNTER — Encounter: Payer: Self-pay | Admitting: Physical Therapy

## 2020-11-27 ENCOUNTER — Other Ambulatory Visit: Payer: Self-pay | Admitting: Family Medicine

## 2020-11-27 ENCOUNTER — Other Ambulatory Visit: Payer: Self-pay

## 2020-11-27 ENCOUNTER — Ambulatory Visit: Payer: No Typology Code available for payment source | Admitting: Physical Therapy

## 2020-11-27 DIAGNOSIS — M5440 Lumbago with sciatica, unspecified side: Secondary | ICD-10-CM

## 2020-11-27 DIAGNOSIS — M62838 Other muscle spasm: Secondary | ICD-10-CM

## 2020-11-27 DIAGNOSIS — R293 Abnormal posture: Secondary | ICD-10-CM

## 2020-11-27 DIAGNOSIS — M542 Cervicalgia: Secondary | ICD-10-CM

## 2020-11-27 NOTE — Therapy (Signed)
[place], [place], [address] Phone: [phone]   Fax:  [phone]  Physical Therapy Treatment  Patient Details  Name: Trevor Monroe MRN: [id number] Date of Birth: 12-Aug-1975 Referring Provider (PT): Dr Dayton Bailiff     Encounter Date: 11/27/2020   PT End of Session - 11/27/20 1045     Visit Number 9    Number of Visits 16    Date for PT Re-Evaluation 01/13/21    Authorization Type 15 visits + eval approved    Authorization Time Period 08/11/2020 - 02/07/2021    Authorization - Visit Number 9    Authorization - Number of Visits 16    PT Start Time 2924    PT Stop Time 1128    PT Time Calculation (min) 43 min    Activity Tolerance Patient tolerated treatment well    Behavior During Therapy [facility] for tasks assessed/performed             History reviewed. No pertinent past medical history.  History reviewed. No pertinent surgical history.  There were no vitals filed for this visit.   Subjective Assessment - 11/27/20 1045     Subjective Pt reports that he is feeling encouraged by his progress.  He was able to get back in to see his acupuncturist which he feels is helping.  He has been HEP compliant.    Pertinent History Per patient L4 disc buldge    Currently in Pain? Yes    Pain Score 4     Pain Location Neck    Pain Orientation Right                               [facility] Adult PT Treatment/Exercise - 11/27/20 0001       Neck Exercises: Machines for Strengthening   UBE (Upper Arm Bike) 2.5' fwd/bkwd level 1    Cybex Row 3x5 @ 65#    Lat Pull 3x5 @ 55#      Shoulder Exercises: Seated   Other Seated Exercises thoracic ext on stool and table    Other Seated Exercises OH press - 3x5 @      Shoulder Exercises: Standing   Other Standing Exercises hip hinge - 45# from 12'' box 2x5      Shoulder Exercises: Stretch   Other Shoulder Stretches 3x30'' ea: prone pec  stretch, biceps stretch, table top                      PT Short Term Goals - 11/18/20 1839       PT SHORT TERM GOAL #1   Title Patient will report a 50% reduction in radicular pain    Baseline no reduction today, was having minimal radicular pain before setback    Time 3    Period Weeks    Status New    Target Date 10/22/20      PT SHORT TERM GOAL #2   Title Patient will demonstrate full cerivcal motion without pain    Baseline Rotation improved    Time 3    Period Weeks    Status Partially Met    Target Date 10/22/20      PT SHORT TERM GOAL #3   Title Patient will report a good understanding of proper posture when perfroming work tasks    Time 3    Period Weeks    Status Achieved  Target Date 10/22/20               PT Long Term Goals - 11/18/20 1842       PT LONG TERM GOAL #1   Title Patient will perfrom all work tasks without increased pain or radicular symptoms    Time 6    Period Weeks    Status Revised    Target Date 01/13/21      PT LONG TERM GOAL #2   Title Patient will return to working out without increased pain    Time 6    Period Weeks    Status Revised    Target Date 01/13/21                   Plan - 11/27/20 1110     Clinical Impression Statement Trevor Monroe is progressing well with therapy.  Today we concentrated on core strengthening, periscapular strengthening, and thoracic mobility.  Pt is able to resume previous level of intensity with therex today.  He does require mod cuing during stretching to maximize effect to target muscles but has good carryover between reps.  Pt reports mild pain reduction following therapy (<3 pts NPRS). HEP was reviewed, but left unchanged.  Pt will continue to benefit from skilled physical therapy to address remaining deficits and achieve listed goals.  Continue per POC.    PT Frequency 1x / week    PT Duration 8 weeks    PT Treatment/Interventions ADLs/Self Care Home  Management;Electrical Stimulation;Iontophoresis 72m/ml Dexamethasone;Moist Heat;Ultrasound;Therapeutic activities;Therapeutic exercise;Neuromuscular re-education;Manual techniques;Patient/family education;Passive range of motion;Taping    PT Next Visit Plan monitor right rotation    PT Home Exercise Plan D2 flexion, and foam roller, cx snag             Patient will benefit from skilled therapeutic intervention in order to improve the following deficits and impairments:     Visit Diagnosis: Cervicalgia  Abnormal posture  Other muscle spasm     Problem List There are no problems to display for this patient.   KShearon BaloPT, DPT 11/27/20 11:30 AM   [facility] [address]Phone: [phone]  Fax:  [phone] Name: Trevor OZBURNMRN: [id number] of Birth: [birth year]

## 2020-12-02 ENCOUNTER — Encounter: Payer: Self-pay | Admitting: Physical Therapy

## 2020-12-02 ENCOUNTER — Other Ambulatory Visit: Payer: Self-pay

## 2020-12-02 ENCOUNTER — Ambulatory Visit: Payer: No Typology Code available for payment source | Admitting: Physical Therapy

## 2020-12-02 DIAGNOSIS — M542 Cervicalgia: Secondary | ICD-10-CM

## 2020-12-02 DIAGNOSIS — M62838 Other muscle spasm: Secondary | ICD-10-CM

## 2020-12-02 DIAGNOSIS — R293 Abnormal posture: Secondary | ICD-10-CM

## 2020-12-02 NOTE — Therapy (Signed)
[place], [place], [address] Phone: [phone]   Fax:  [phone]  Physical Therapy Treatment  Patient Details  Name: Trevor Monroe MRN: [id number] Date of Birth: Apr 22, 1976 Referring Provider (PT): Dr Dayton Bailiff   Encounter Date: 12/02/2020   PT End of Session - 12/02/20 1047     Visit Number 10    Number of Visits 16    Date for PT Re-Evaluation 01/13/21    Authorization Type 15 visits + eval approved    Authorization Time Period 08/11/2020 - 02/07/2021    Authorization - Visit Number --   same as visit number   Authorization - Number of Visits 16    PT Start Time 1045    PT Stop Time 1130    PT Time Calculation (min) 45 min    Activity Tolerance Patient tolerated treatment well    Behavior During Therapy [facility] for tasks assessed/performed             History reviewed. No pertinent past medical history.  History reviewed. No pertinent surgical history.  There were no vitals filed for this visit.   Subjective Assessment - 12/02/20 1051     Subjective Pt reports that overall he is doing well.  He did have some tightening of his neck yesterday with no clear cause.  This is much better today though.    Pertinent History Per patient L4 disc buldge    Currently in Pain? Yes    Pain Score 3     Pain Location Neck                               [facility] Adult PT Treatment/Exercise - 12/02/20 0001       Neck Exercises: Machines for Strengthening   UBE (Upper Arm Bike) 2.5' fwd/bkwd level 1    Cybex Row --    Lat Pull --      Neck Exercises: Standing   Other Standing Exercises row (blue TB) with bil ER (RTB) 3x10      Shoulder Exercises: Supine   Other Supine Exercises thoracic ext over wedge 2x10      Shoulder Exercises: Seated   Other Seated Exercises --      Shoulder Exercises: Prone   Other Prone Exercises prone W on physio ball - 2x10 5''      Shoulder  Exercises: Standing   Other Standing Exercises hip hinge - 45# from 11'' box 2x5  25# single arm from 11" box 2x5 ea      Shoulder Exercises: Stretch   Other Shoulder Stretches 3x30'' ea: prone pec stretch, biceps stretch, table top                      PT Short Term Goals - 11/18/20 1839       PT SHORT TERM GOAL #1   Title Patient will report a 50% reduction in radicular pain    Baseline no reduction today, was having minimal radicular pain before setback    Time 3    Period Weeks    Status New    Target Date 10/22/20      PT SHORT TERM GOAL #2   Title Patient will demonstrate full cerivcal motion without pain    Baseline Rotation improved    Time 3    Period Weeks    Status Partially Met    Target Date  10/22/20      PT SHORT TERM GOAL #3   Title Patient will report a good understanding of proper posture when perfroming work tasks    Time 3    Period Weeks    Status Achieved    Target Date 10/22/20               PT Long Term Goals - 11/18/20 1842       PT LONG TERM GOAL #1   Title Patient will perfrom all work tasks without increased pain or radicular symptoms    Time 6    Period Weeks    Status Revised    Target Date 01/13/21      PT LONG TERM GOAL #2   Title Patient will return to working out without increased pain    Time 6    Period Weeks    Status Revised    Target Date 01/13/21                   Plan - 12/02/20 1148     Clinical Impression Statement Trevor Monroe is progressing well with therapy.  Today we concentrated on periscapular strengthening, thoracic mobility, and upper extremity flexibility.  Pt fatigues rapidly with prone W on ball and row with external rotation indicating poor strength and endurance of R/C and mid and lower traps.  Pt reports no change in baseline pain following therapy. HEP was updated and reissued to patient.  Pt will continue to benefit from skilled physical therapy to address remaining  deficits and achieve listed goals.  Continue per POC.    PT Frequency 1x / week    PT Duration 8 weeks    PT Treatment/Interventions ADLs/Self Care Home Management;Electrical Stimulation;Iontophoresis 45m/ml Dexamethasone;Moist Heat;Ultrasound;Therapeutic activities;Therapeutic exercise;Neuromuscular re-education;Manual techniques;Patient/family education;Passive range of motion;Taping    PT Next Visit Plan monitor right rotation    PT Home Exercise Plan Hip hinge with black TB, foam roller, cx snag             Patient will benefit from skilled therapeutic intervention in order to improve the following deficits and impairments:     Visit Diagnosis: Cervicalgia  Abnormal posture  Other muscle spasm     Problem List There are no problems to display for this patient.   KShearon BaloPT, DPT 12/02/20 11:50 AM  [facility] [address]Phone: [phone]  Fax:  [phone] Name: Trevor FESPERMANMRN: [id number] of Birth: 5January 21, 1977

## 2020-12-09 ENCOUNTER — Ambulatory Visit: Payer: No Typology Code available for payment source | Admitting: Physical Therapy

## 2020-12-23 ENCOUNTER — Encounter: Payer: Self-pay | Admitting: Physical Therapy

## 2020-12-23 ENCOUNTER — Ambulatory Visit: Payer: No Typology Code available for payment source | Attending: Neurosurgery | Admitting: Physical Therapy

## 2020-12-23 ENCOUNTER — Other Ambulatory Visit: Payer: Self-pay

## 2020-12-23 DIAGNOSIS — M62838 Other muscle spasm: Secondary | ICD-10-CM | POA: Insufficient documentation

## 2020-12-23 DIAGNOSIS — M542 Cervicalgia: Secondary | ICD-10-CM | POA: Insufficient documentation

## 2020-12-23 DIAGNOSIS — R293 Abnormal posture: Secondary | ICD-10-CM | POA: Insufficient documentation

## 2020-12-23 NOTE — Therapy (Signed)
[place], [place], [address] Phone: [phone]   Fax:  [phone]  Physical Therapy Treatment  Patient Details  Name: Trevor Monroe MRN: [id number] Date of Birth: Mar 16, 1976 Referring Provider (PT): Dr Dayton Bailiff   Encounter Date: 12/23/2020   PT End of Session - 12/23/20 1021     Visit Number 11    Number of Visits 16    Date for PT Re-Evaluation 01/13/21    Authorization Type 15 visits + eval approved    Authorization Time Period 08/11/2020 - 02/07/2021    Authorization - Visit Number 11    Authorization - Number of Visits 16    PT Start Time 1018    PT Stop Time 1100    PT Time Calculation (min) 42 min    Activity Tolerance Patient tolerated treatment well    Behavior During Therapy [facility] for tasks assessed/performed             History reviewed. No pertinent past medical history.  History reviewed. No pertinent surgical history.  There were no vitals filed for this visit.   Subjective Assessment - 12/23/20 1021     Subjective " I don't konw if there is any rhyme or reason why it starts but today I am doing pretty good."    Currently in Pain? No/denies                               [facility] Adult PT Treatment/Exercise - 12/23/20 0001       Neck Exercises: Machines for Strengthening   UBE (Upper Arm Bike) x 8 min L6   fwd/ bwd x 4 min each, sprinting every other 30 sec   Cybex Row 3 x 5 @ 65#    Lat Pull 3 x5 @ 65#   reviewed proper form     Shoulder Exercises: Standing   Other Standing Exercises wall clocks with blue TB, 2 x 3 bil    Other Standing Exercises lower trap wall y's with blue TB between hands 2 x 15      Shoulder Exercises: Body Blade   Other Body Blade Exercises IR / ER body blade 3 x 30 sec      Manual Therapy   Manual therapy comments skilled palpation and monitroing of pt throughout TPDN    Soft tissue mobilization IASTM along bil upper trap/  levator scapulae.              Trigger Point Dry Needling - 12/23/20 0001     Consent Given? Yes    Education Handout Provided Previously provided    Muscles Treated Head and Neck Upper trapezius;Levator scapulae    Upper Trapezius Response Twitch reponse elicited;Palpable increased muscle length   bil   Levator Scapulae Response Twitch response elicited;Palpable increased muscle length   bil                   PT Short Term Goals - 11/18/20 1839       PT SHORT TERM GOAL #1   Title Patient will report a 50% reduction in radicular pain    Baseline no reduction today, was having minimal radicular pain before setback    Time 3    Period Weeks    Status New    Target Date 10/22/20      PT SHORT TERM GOAL #2   Title Patient will demonstrate full cerivcal  motion without pain    Baseline Rotation improved    Time 3    Period Weeks    Status Partially Met    Target Date 10/22/20      PT SHORT TERM GOAL #3   Title Patient will report a good understanding of proper posture when perfroming work tasks    Time 3    Period Weeks    Status Achieved    Target Date 10/22/20               PT Long Term Goals - 11/18/20 1842       PT LONG TERM GOAL #1   Title Patient will perfrom all work tasks without increased pain or radicular symptoms    Time 6    Period Weeks    Status Revised    Target Date 01/13/21      PT LONG TERM GOAL #2   Title Patient will return to working out without increased pain    Time 6    Period Weeks    Status Revised    Target Date 01/13/21                   Plan - 12/23/20 1051     Clinical Impression Statement Jalene is making good progress with physical therapy noting no pain today before treatment. he does report continued pain intermittently with no specific onset. Continued TPDN focusing on bil upper trap/ levator scapuale followed with IASTM techniques. continued working on focus of posterior shoulder strengthening. He  did well with exercise today but did fatigue quckly with body blade IR/ER.    PT Treatment/Interventions ADLs/Self Care Home Management;Electrical Stimulation;Iontophoresis 38m/ml Dexamethasone;Moist Heat;Ultrasound;Therapeutic activities;Therapeutic exercise;Neuromuscular re-education;Manual techniques;Patient/family education;Passive range of motion;Taping    PT Next Visit Plan monitor right rotation, shoulder strengthening, quadruped I,t,y, lifting mechanics.    PT Home Exercise Plan Hip hinge with black TB, foam roller, cx snag    Consulted and Agree with Plan of Care Patient             Patient will benefit from skilled therapeutic intervention in order to improve the following deficits and impairments:  Increased fascial restricitons, Decreased endurance, Pain, Decreased mobility, Decreased strength, Improper body mechanics, Decreased activity tolerance, Increased muscle spasms  Visit Diagnosis: Cervicalgia  Abnormal posture  Other muscle spasm     Problem List There are no problems to display for this patient.  KStarr LakePT, DPT, LAT, ATC  12/23/20  11:01 AM      [facility]GRockford [place] [address]Phone: [phone]  Fax:  [phone] Name: Trevor FESTAMRN: [id number] of Birth: 506-03-1976

## 2020-12-30 ENCOUNTER — Other Ambulatory Visit: Payer: Self-pay

## 2020-12-30 ENCOUNTER — Encounter: Payer: Self-pay | Admitting: Physical Therapy

## 2020-12-30 ENCOUNTER — Ambulatory Visit: Payer: No Typology Code available for payment source | Admitting: Physical Therapy

## 2020-12-30 DIAGNOSIS — M542 Cervicalgia: Secondary | ICD-10-CM

## 2020-12-30 DIAGNOSIS — M62838 Other muscle spasm: Secondary | ICD-10-CM

## 2020-12-30 DIAGNOSIS — R293 Abnormal posture: Secondary | ICD-10-CM

## 2020-12-30 NOTE — Therapy (Signed)
[place], [place], [address] Phone: [phone]   Fax:  [phone]  Physical Therapy Treatment  Patient Details  Name: Trevor Monroe MRN: [id number] Date of Birth: 1975-12-24 Referring Provider (PT): Dr Dayton Bailiff   Encounter Date: 12/30/2020   PT End of Session - 12/30/20 1020     Visit Number 12    Number of Visits 16    Date for PT Re-Evaluation 01/13/21    Authorization Type 15 visits + eval approved    Authorization Time Period 08/11/2020 - 02/07/2021    Authorization - Visit Number 12    Authorization - Number of Visits 16    PT Start Time 1020   pt arrived late today   PT Stop Time 1101    PT Time Calculation (min) 41 min             History reviewed. No pertinent past medical history.  History reviewed. No pertinent surgical history.  There were no vitals filed for this visit.   Subjective Assessment - 12/30/20 1023     Subjective "I still get some stiffness in the shoulder, not pain just nagging."    Currently in Pain? No/denies    Pain Score 0-No pain    Pain Orientation Right    Pain Descriptors / Indicators Aching    Pain Type Chronic pain    Pain Onset More than a month ago    Pain Frequency Intermittent                [facility] PT Assessment - 12/30/20 0001       Assessment   Medical Diagnosis Cervicalgia    Referring Provider (PT) Dr Dayton Bailiff                           [facility] Adult PT Treatment/Exercise - 12/30/20 0001       Neck Exercises: Machines for Strengthening   UBE (Upper Arm Bike) x 8 min L6   fwd/ bwd x 4 min each, sprinting every other 30 sec     Shoulder Exercises: Prone   Other Prone Exercises I, T, Y 2 x 12 in quadruped pos. protraction with LUE, and 2# weight with RUE only      Shoulder Exercises: Standing   Other Standing Exercises TRX rows 2 x 15, TRX horzontal abd 2 x 15      Shoulder Exercises: Body Blade   ABduction  30 seconds;2 reps   RUE   Other Body Blade Exercises forward flexion @ shoulder height 2 x 30    Other Body Blade Exercises IR / ER body blade 3 x 30 sec      Manual Therapy   Manual therapy comments MTPR for bil upper trap/ rhomboids   combined with use of theracane                   PT Education - 12/30/20 1025     Education Details How to use theracane and benefits of self treatment.    Person(s) Educated Patient    Methods Explanation    Comprehension Verbalized understanding              PT Short Term Goals - 11/18/20 1839       PT SHORT TERM GOAL #1   Title Patient will report a 50% reduction in radicular pain    Baseline no reduction today, was having minimal radicular  pain before setback    Time 3    Period Weeks    Status New    Target Date 10/22/20      PT SHORT TERM GOAL #2   Title Patient will demonstrate full cerivcal motion without pain    Baseline Rotation improved    Time 3    Period Weeks    Status Partially Met    Target Date 10/22/20      PT SHORT TERM GOAL #3   Title Patient will report a good understanding of proper posture when perfroming work tasks    Time 3    Period Weeks    Status Achieved    Target Date 10/22/20               PT Long Term Goals - 11/18/20 1842       PT LONG TERM GOAL #1   Title Patient will perfrom all work tasks without increased pain or radicular symptoms    Time 6    Period Weeks    Status Revised    Target Date 01/13/21      PT LONG TERM GOAL #2   Title Patient will return to working out without increased pain    Time 6    Period Weeks    Status Revised    Target Date 01/13/21                   Plan - 12/30/20 1057     Clinical Impression Statement pt arrives noting doing better noting mostly only stiffness in the neck. continued focus on posterior sholder strengthening progressing resistance. He did well with all exercises but did not fatigue especially with exercisees in  quadruped position. end of session he noted feeling better and reported no pain.    PT Treatment/Interventions ADLs/Self Care Home Management;Electrical Stimulation;Iontophoresis 31m/ml Dexamethasone;Moist Heat;Ultrasound;Therapeutic activities;Therapeutic exercise;Neuromuscular re-education;Manual techniques;Patient/family education;Passive range of motion;Taping    PT Next Visit Plan monitor right rotation, shoulder strengthening, quadruped I,t,y, lifting mechanics.    PT Home Exercise Plan Hip hinge with black TB, foam roller, cx snag    Consulted and Agree with Plan of Care Patient             Patient will benefit from skilled therapeutic intervention in order to improve the following deficits and impairments:  Increased fascial restricitons, Decreased endurance, Pain, Decreased mobility, Decreased strength, Improper body mechanics, Decreased activity tolerance, Increased muscle spasms  Visit Diagnosis: Cervicalgia  Abnormal posture  Other muscle spasm     Problem List There are no problems to display for this patient.  KStarr LakePT, DPT, LAT, ATC  12/30/20  11:05 AM      [facility] [address]Phone: [phone]  Fax:  [phone] Name: Trevor ALLBRITTONMRN: [id number] of Birth: [birth year]

## 2021-01-06 ENCOUNTER — Ambulatory Visit: Payer: No Typology Code available for payment source | Admitting: Physical Therapy

## 2021-01-12 ENCOUNTER — Ambulatory Visit
Admission: RE | Admit: 2021-01-12 | Discharge: 2021-01-12 | Disposition: A | Discharge status: Home / Self Care | Payer: No Typology Code available for payment source | Source: Ambulatory Visit | Attending: Family Medicine | Admitting: Family Medicine

## 2021-01-12 ENCOUNTER — Other Ambulatory Visit: Payer: Self-pay

## 2021-01-12 DIAGNOSIS — M5440 Lumbago with sciatica, unspecified side: Secondary | ICD-10-CM | POA: Insufficient documentation

## 2021-01-13 ENCOUNTER — Encounter: Payer: No Typology Code available for payment source | Admitting: Physical Therapy

## 2021-01-18 ENCOUNTER — Other Ambulatory Visit: Payer: Self-pay

## 2021-01-18 ENCOUNTER — Ambulatory Visit: Payer: No Typology Code available for payment source | Attending: Neurosurgery | Admitting: Physical Therapy

## 2021-01-18 ENCOUNTER — Encounter: Payer: Self-pay | Admitting: Physical Therapy

## 2021-01-18 DIAGNOSIS — M542 Cervicalgia: Secondary | ICD-10-CM | POA: Diagnosis present

## 2021-01-18 DIAGNOSIS — R293 Abnormal posture: Secondary | ICD-10-CM | POA: Diagnosis present

## 2021-01-18 DIAGNOSIS — M62838 Other muscle spasm: Secondary | ICD-10-CM | POA: Diagnosis present

## 2021-01-18 NOTE — Therapy (Signed)
[place], [place], [address] Phone: [phone]   Fax:  [phone]  Physical Therapy Treatment / Re-certification  Patient Details  Name: Trevor Monroe MRN: [id number] Date of Birth: 1975-12-27 Referring Provider (PT): Dr Dayton Bailiff   Encounter Date: 01/18/2021   PT End of Session - 01/18/21 1419     Visit Number 13    Number of Visits 19    Date for PT Re-Evaluation 03/01/21    Authorization Type 15 visits + eval approved    Authorization Time Period 08/11/2020 - 02/07/2021    Authorization - Visit Number 13    Authorization - Number of Visits 16    PT Start Time 3704    PT Stop Time 1458    PT Time Calculation (min) 41 min    Activity Tolerance Patient tolerated treatment well    Behavior During Therapy [facility] for tasks assessed/performed             History reviewed. No pertinent past medical history.  History reviewed. No pertinent surgical history.  There were no vitals filed for this visit.   Subjective Assessment - 01/18/21 1419     Subjective " I notice when I have a gap in treatment of PT and accupuncture I tend to get a flare up. I am frustrated that the MD can't see any"    Currently in Pain? Yes    Pain Score 3     Pain Orientation Right    Pain Descriptors / Indicators Aching    Pain Type Chronic pain    Pain Onset More than a month ago    Pain Frequency Intermittent    Aggravating Factors  not sure    Pain Relieving Factors accupuncture                [facility] PT Assessment - 01/18/21 0001       Assessment   Medical Diagnosis Cervicalgia    Referring Provider (PT) Dr Dayton Bailiff      Observation/Other Assessments   Focus on Therapeutic Outcomes (FOTO)  67                           [place] Adult PT Treatment/Exercise - 01/18/21 0001       Neck Exercises: Seated   Other Seated Exercise self first rib mob grade  x 5 holding 3      Shoulder  Exercises: Supine   Other Supine Exercises thoracic extension 1 x 10 with hands behind the head and elbows adducted   deep breathing in with ext and out with flexion     Shoulder Exercises: Prone   Other Prone Exercises scapular retraction with shoulder extension and chin 1 x 10 holding 5 seconds    Other Prone Exercises thoracic roation with 1 hand behind the head 1 x 10 bil      Manual Therapy   Manual Therapy Joint mobilization    Joint Mobilization T1-T8 PA grade III-IV with caviation noted, bil first rib inferiro                    PT Education - 01/18/21 1628     Education Details Reviewed FOTO, POC moving foward, and goals.    Person(s) Educated Patient    Methods Explanation;Verbal cues    Comprehension Verbalized understanding;Verbal cues required              PT Short  Term Goals - 01/18/21 1424       PT SHORT TERM GOAL #1   Title Patient will report a 50% reduction in radicular pain    Baseline still gets the N/T from time to time    Time 3    Period Weeks    Status Partially Met      PT SHORT TERM GOAL #2   Title Patient will demonstrate full cerivcal motion without pain    Period Weeks      PT SHORT TERM GOAL #3   Title Patient will report a good understanding of proper posture when perfroming work tasks    Period Weeks    Status Achieved               PT Long Term Goals - 01/18/21 1428       PT LONG TERM GOAL #1   Title Patient will perfrom all work tasks without increased pain or radicular symptoms    Period Weeks    Status Partially Met      PT LONG TERM GOAL #2   Title Patient will return to working out without increased pain    Status On-going                   Plan - 01/18/21 1433     Clinical Impression Statement pt arrives to treatment noting he cancelled his last session due to a flare up which common cause per pt reprot is gap in treatment and traveling. He is making good progress toward his goals today but  continues to report mid / upper thoracic pain with radiation to the ribs. Focus on throacic and rib mobility which he noted decrease in pain. He would benefit from continued physcial therapy to maxiimze his thoracic mobility and posterior shoulder strength by addressing the deficits listed and working toward discharge.    Rehab Potential Excellent    PT Frequency 1x / week    PT Duration 6 weeks    PT Treatment/Interventions ADLs/Self Care Home Management;Electrical Stimulation;Iontophoresis 52m/ml Dexamethasone;Moist Heat;Ultrasound;Therapeutic activities;Therapeutic exercise;Neuromuscular re-education;Manual techniques;Patient/family education;Passive range of motion;Taping    PT Next Visit Plan submit to [place] 3 visits. monitor right rotation, shoulder strengthening, quadruped I,t,y, lifting mechanics. thoracic mobility    PT Home Exercise Plan Hip hinge with black TB, foam roller, cx snag    Consulted and Agree with Plan of Care Patient             Patient will benefit from skilled therapeutic intervention in order to improve the following deficits and impairments:  Increased fascial restricitons, Decreased endurance, Pain, Decreased mobility, Decreased strength, Improper body mechanics, Decreased activity tolerance, Increased muscle spasms  Visit Diagnosis: Cervicalgia  Abnormal posture  Other muscle spasm     Problem List There are no problems to display for this patient.  KStarr LakePT, DPT, LAT, ATC  01/18/21  4:32 PM     CS[facility]alsCM[facility]ris Village[address] [address]Phone: [phone]  Fax:  [phone] Name: Trevor HAUBNERMRN: [id number] of Birth: [birth year]

## 2021-01-27 ENCOUNTER — Encounter: Payer: Self-pay | Admitting: Physical Therapy

## 2021-01-27 ENCOUNTER — Other Ambulatory Visit: Payer: Self-pay

## 2021-01-27 ENCOUNTER — Ambulatory Visit: Payer: No Typology Code available for payment source | Admitting: Physical Therapy

## 2021-01-27 DIAGNOSIS — R293 Abnormal posture: Secondary | ICD-10-CM

## 2021-01-27 DIAGNOSIS — M542 Cervicalgia: Secondary | ICD-10-CM

## 2021-01-27 DIAGNOSIS — M62838 Other muscle spasm: Secondary | ICD-10-CM

## 2021-01-27 NOTE — Therapy (Signed)
[place], [place], [address] Phone: [phone]   Fax:  [phone]  Physical Therapy Treatment  Patient Details  Name: Trevor Monroe MRN: [id number] Date of Birth: [birth year] Referring Provider (PT): Dr Dayton Bailiff   Encounter Date: 01/27/2021   PT End of Session - 01/27/21 1046     Visit Number 14    Number of Visits 19    Date for PT Re-Evaluation 03/01/21    Authorization Type 15 visits + eval approved    Authorization Time Period 08/11/2020 - 02/07/2021    Authorization - Visit Number 13    Authorization - Number of Visits 16    PT Start Time 2423    PT Stop Time 1128    PT Time Calculation (min) 43 min    Activity Tolerance Patient tolerated treatment well    Behavior During Therapy [facility] for tasks assessed/performed             History reviewed. No pertinent past medical history.  History reviewed. No pertinent surgical history.  There were no vitals filed for this visit.   Subjective Assessment - 01/27/21 1054     Subjective Pt reports that his neck has been doing fairly well.  Today his low back is hurting more than his neck.  3/10 neck pain with 6/10 low back pain today.    Pain Onset More than a month ago              [facility] Adult PT Treatment/Exercise:  Therapeutic Exercise: - UBE, L3 2.5/2/5 fwd/backward - bird dog - 2# weights on wrists - 2x10 w/ 5'' hold - bridge with 10# KB flexion (90 to ~150 degrees) - 3x10 - open book - 15x ea - LE X over - 15x ea - cat camel - 15x - side plank - 6'' 2x5 ea - plank with hip ext - 10x  Manual therapy: - not today     PT Short Term Goals - 01/18/21 1424       PT SHORT TERM GOAL #1   Title Patient will report a 50% reduction in radicular pain    Baseline still gets the N/T from time to time    Time 3    Period Weeks    Status Partially Met      PT SHORT TERM GOAL #2   Title Patient will demonstrate full cerivcal  motion without pain    Period Weeks      PT SHORT TERM GOAL #3   Title Patient will report a good understanding of proper posture when perfroming work tasks    Period Weeks    Status Achieved               PT Long Term Goals - 01/18/21 1428       PT LONG TERM GOAL #1   Title Patient will perfrom all work tasks without increased pain or radicular symptoms    Period Weeks    Status Partially Met      PT LONG TERM GOAL #2   Title Patient will return to working out without increased pain    Status On-going                   Plan - 01/27/21 1127     Clinical Impression Statement Pt reports significant pain reduction following therapy (>/= 3 pts NPRS) (low back) HEP was reviewed, but left unchanged    Overall, MUBARAK BEVENS is  progressing well with therapy.  Today we concentrated on core strengthening and periscapular strengthening.  We integrated more full body closed chain exercises today to work on integration of core and shoulder activatoin.  Pt reports fatigue but a reduction in low back pain and mild reduction in neck pain.  Pt will continue to benefit from skilled physical therapy to address remaining deficits and achieve listed goals.  Continue per POC.    Rehab Potential Excellent    PT Frequency 1x / week    PT Duration 6 weeks    PT Treatment/Interventions ADLs/Self Care Home Management;Electrical Stimulation;Iontophoresis 87m/ml Dexamethasone;Moist Heat;Ultrasound;Therapeutic activities;Therapeutic exercise;Neuromuscular re-education;Manual techniques;Patient/family education;Passive range of motion;Taping    PT Next Visit Plan submit to VSamakin 3 visits. monitor right rotation, shoulder strengthening, quadruped I,t,y, lifting mechanics. thoracic mobility    PT Home Exercise Plan Hip hinge with black TB, foam roller, cx snag    Consulted and Agree with Plan of Care Patient             Patient will benefit from skilled therapeutic intervention in order  to improve the following deficits and impairments:  Increased fascial restricitons, Decreased endurance, Pain, Decreased mobility, Decreased strength, Improper body mechanics, Decreased activity tolerance, Increased muscle spasms  Visit Diagnosis: Cervicalgia  Abnormal posture  Other muscle spasm     Problem List There are no problems to display for this patient.   KShearon BaloPT, DPT 01/27/21 11:29 AM  CA[facility] [address]Phone: [phone]  Fax:  [phone] Name: JFENRIS CAUBLEMRN: [id number] of Birth: 504/09/1975

## 2021-02-03 ENCOUNTER — Ambulatory Visit: Payer: No Typology Code available for payment source | Admitting: Physical Therapy

## 2021-02-03 ENCOUNTER — Other Ambulatory Visit: Payer: Self-pay

## 2021-02-03 ENCOUNTER — Encounter: Payer: Self-pay | Admitting: Physical Therapy

## 2021-02-03 DIAGNOSIS — M542 Cervicalgia: Secondary | ICD-10-CM

## 2021-02-03 DIAGNOSIS — M62838 Other muscle spasm: Secondary | ICD-10-CM

## 2021-02-03 DIAGNOSIS — R293 Abnormal posture: Secondary | ICD-10-CM

## 2021-02-03 NOTE — Therapy (Signed)
[place], [place], [address] Phone: [phone]   Fax:  [phone]  Physical Therapy Treatment  Patient Details  Name: Trevor Monroe MRN: [id number] Date of Birth: [birth year] Referring Provider (PT): Dr Dayton Bailiff   Encounter Date: 02/03/2021   PT End of Session - 02/03/21 1056     Visit Number 15    Number of Visits 19    Date for PT Re-Evaluation 03/01/21    Authorization Type 15 visits + eval approved    Authorization Time Period 08/11/2020 - 02/07/2021    Authorization - Visit Number 15    Authorization - Number of Visits 16    PT Start Time 3646   pt arrived late   PT Stop Time 1125    PT Time Calculation (min) 29 min    Activity Tolerance Patient tolerated treatment well    Behavior During Therapy [facility] for tasks assessed/performed             History reviewed. No pertinent past medical history.  History reviewed. No pertinent surgical history.  There were no vitals filed for this visit.   Subjective Assessment - 02/03/21 1103     Subjective Pt reports that he had a cervical manipulation on Monday at chiro which has lead to very high levels of pain in the neck and R arm.  He feels he has had a major set back.  He puts his R neck and shoulder pain at 9/10 aggs: neck movements  eases: rest    Pertinent History Per patient L4 disc buldge    Limitations Standing;Walking    Pain Onset More than a month ago            [facility] Adult PT Treatment/Exercise:   Therapeutic Exercise: - UBE, L3 2.5/2/5 fwd/backward   Manual therapy: - cervical distraction - sub occipital release - STM cervical paraspinals, pt in supine      PT Short Term Goals - 01/18/21 1424       PT SHORT TERM GOAL #1   Title Patient will report a 50% reduction in radicular pain    Baseline still gets the N/T from time to time    Time 3    Period Weeks    Status Partially Met      PT SHORT TERM GOAL #2    Title Patient will demonstrate full cerivcal motion without pain    Period Weeks      PT SHORT TERM GOAL #3   Title Patient will report a good understanding of proper posture when perfroming work tasks    Period Weeks    Status Achieved               PT Long Term Goals - 01/18/21 1428       PT LONG TERM GOAL #1   Title Patient will perfrom all work tasks without increased pain or radicular symptoms    Period Weeks    Status Partially Met      PT LONG TERM GOAL #2   Title Patient will return to working out without increased pain    Status On-going                   Plan - 02/03/21 1357     Clinical Impression Statement Trevor Monroe was limited by pain today.  A recent chiropractic manipulation seems to has irritated his neck significantly.  Today we concentrated on pain reduction.  Manual therapy helped relieve  some tension in his cervical region, but his pain remained high.  We will check goals and plan on D/C next visit.  I recommended he make an appt with his MD to discuss next steps.    Rehab Potential Excellent    PT Frequency 1x / week    PT Duration 6 weeks    PT Treatment/Interventions ADLs/Self Care Home Management;Electrical Stimulation;Iontophoresis 36m/ml Dexamethasone;Moist Heat;Ultrasound;Therapeutic activities;Therapeutic exercise;Neuromuscular re-education;Manual techniques;Patient/family education;Passive range of motion;Taping    PT Next Visit Plan submit to VTuliain 3 visits. monitor right rotation, shoulder strengthening, quadruped I,t,y, lifting mechanics. thoracic mobility    PT Home Exercise Plan Hip hinge with black TB, foam roller, cx snag    Consulted and Agree with Plan of Care Patient             Patient will benefit from skilled therapeutic intervention in order to improve the following deficits and impairments:  Increased fascial restricitons, Decreased endurance, Pain, Decreased mobility, Decreased strength, Improper body mechanics,  Decreased activity tolerance, Increased muscle spasms  Visit Diagnosis: Cervicalgia  Abnormal posture  Other muscle spasm     Problem List There are no problems to display for this patient.   KShearon BaloPT, DPT 02/03/21 1:58 PM  [facility] [address]Phone: [phone]  Fax:  [phone] Name: Trevor NUSZMRN: [id number] of Birth: [birth year]

## 2021-02-09 ENCOUNTER — Encounter: Payer: Self-pay | Admitting: Physical Therapy

## 2021-02-09 ENCOUNTER — Other Ambulatory Visit: Payer: Self-pay

## 2021-02-09 ENCOUNTER — Ambulatory Visit: Payer: No Typology Code available for payment source | Admitting: Physical Therapy

## 2021-02-09 DIAGNOSIS — M542 Cervicalgia: Secondary | ICD-10-CM

## 2021-02-09 NOTE — Therapy (Signed)
[place]revor [place], [place], [address] Phone: [phone]   Fax:  [phone]  PHYSICAL THERAPY DISCHARGE SUMMARY  Visits from Start of Care: 16  Current functional level related to goals / functional outcomes: See assessment/goals   Remaining deficits: See assessment/goals   Education / Equipment: HEP and D/C plans  Patient agrees to discharge. Patient goals were partially met. Patient is being discharged due to meeting the stated rehab goals.   Physical Therapy Treatment  Patient Details  Name: Trevor Monroe MRN: [id number] Date of Birth: Oct 03, 1975 Referring Provider (PT): Dr Dayton Bailiff   Encounter Date: 02/09/2021   PT End of Session - 02/09/21 1048     Visit Number 16    Number of Visits 19    Date for PT Re-Evaluation 03/01/21    Authorization Type 15 visits + eval approved    Authorization Time Period 08/11/2020 - 02/07/2021    Authorization - Visit Number 15    Authorization - Number of Visits 16    Activity Tolerance Patient tolerated treatment well    Behavior During Therapy [facility] for tasks assessed/performed             History reviewed. No pertinent past medical history.  History reviewed. No pertinent surgical history.  There were no vitals filed for this visit.   Subjective Assessment - 02/09/21 1048     Subjective Pt reports that he is feeling better after taking it easy since last session.  He will be following up with his MD on Thursday.  He puts his R neck and shoulder pain at 4/10 aggs: neck movements  eases: rest    Pertinent History Per patient L4 disc buldge    Limitations Standing;Walking    Pain Onset More than a month ago            [facility] Adult PT Treatment/Exercise:  Therapeutic Exercise: - UBE, L3 2.5/2/5 fwd/backward - Foam roller routine for thoracic mobility - including protraction/retraction (unilateral and bilateral), cc/cw circles, horizontal abduction,  shoulder flexion/ext alternating, shoulder abduction, thread the needle, and thoracic ext - bird dog - x10 w/ 10'' hold - bridge with 10# KB flexion (90 to ~150 degrees) - 3x10 - cat camel - 15x - side plank - 6'' 2x5 ea - plank with hip ext - 10x - prone W - 2x10 10'' hold   PT Short Term Goals - 02/09/21 1051       PT SHORT TERM GOAL #1   Title Patient will report a 50% reduction in radicular pain    Baseline still gets the N/T from time to time 8/30: MET    Time 3    Period Weeks    Status Achieved      PT SHORT TERM GOAL #2   Title Patient will demonstrate full cerivcal motion without pain    Baseline 8/30: NOT MET    Period Weeks    Status Not Met      PT SHORT TERM GOAL #3   Title Patient will report a good understanding of proper posture when perfroming work tasks    Baseline 8/30: MET    Period Weeks    Status Achieved               PT Long Term Goals - 02/09/21 1053       PT LONG TERM GOAL #1   Title Patient will perfrom all work tasks without increased pain or radicular symptoms  Baseline 8/30: met with light activities, not with heavy loads    Period Weeks    Status Partially Met      PT LONG TERM GOAL #2   Title Patient will return to working out without increased pain    Baseline 8/30: pain is intermittent    Status Partially Met                   Plan - 02/09/21 1315     Clinical Impression Statement [name] has progressed well with therapy.  Improved impairments include: cervical ROM, periscapular and cervical muscle strength, neck pain, low back pain.  Functional improvements include: ability to work with minimal pain, ability to return to light workouts.  Progressions needed include: continued work at home with HEP, gradual return to yoga.  Barriers to progress include: none.  Please see baseline and/or status section in "Goals" for specific progress on short term and long term goals established at evaluation.  I recommend  D/C home with HEP; pt agrees with plan.    Rehab Potential Excellent    PT Frequency 1x / week    PT Duration 6 weeks    PT Treatment/Interventions ADLs/Self Care Home Management;Electrical Stimulation;Iontophoresis 32m/ml Dexamethasone;Moist Heat;Ultrasound;Therapeutic activities;Therapeutic exercise;Neuromuscular re-education;Manual techniques;Patient/family education;Passive range of motion;Taping    PT Next Visit Plan submit to VJoaquinin 3 visits. monitor right rotation, shoulder strengthening, quadruped I,t,y, lifting mechanics. thoracic mobility    PT Home Exercise Plan Hip hinge with black TB, foam roller, cx snag    Consulted and Agree with Plan of Care Patient             Patient will benefit from skilled therapeutic intervention in order to improve the following deficits and impairments:  Increased fascial restricitons, Decreased endurance, Pain, Decreased mobility, Decreased strength, Improper body mechanics, Decreased activity tolerance, Increased muscle spasms  Visit Diagnosis: Cervicalgia     Problem List There are no problems to display for this patient.   KShearon BaloPT, DPT 02/09/21 1:18 PM  [facility] C[facility]GGay N[place] [address]Phone: [phone]  Fax:  [phone] Name: Trevor MEDINAMRN: [id number] of Birth: [birth year]

## 2021-02-17 ENCOUNTER — Encounter: Payer: No Typology Code available for payment source | Admitting: Physical Therapy

## 2021-09-02 IMAGING — MR MR LUMBAR SPINE W/O CM
3 series · 26 of 48 positions shown · non-contrast
Comparison: MRI of the lumbar spine August 07, 2012.

CLINICAL DATA: Bilateral low back pain with sciatica, sciatica
laterality unspecified, unspecified chronicity ZZC.C2 (MER-OJ-CM).

EXAM:
MRI LUMBAR SPINE WITHOUT CONTRAST
TECHNIQUE: Multiplanar, multisequence MR imaging of the lumbar spine was
performed. No intravenous contrast was administered.

[Series 5: T2 · sagittal · 4.0mm · 0.81mm/px · 9 of 17 slices shown (1 of 2)]
[im 1/17]
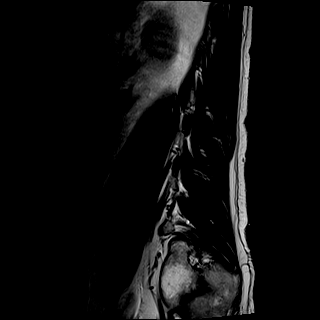
[im 3/17]
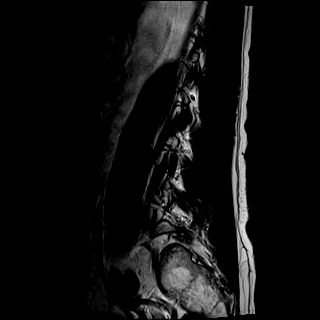
[im 5/17]
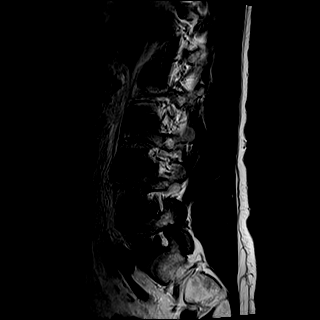
[im 8/17]
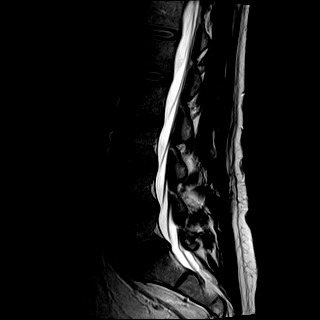
[im 9/17]
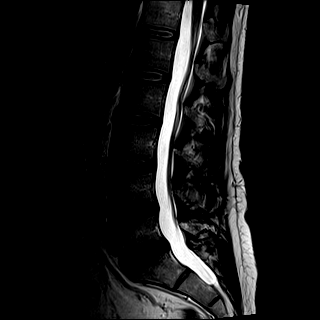
[im 12/17]
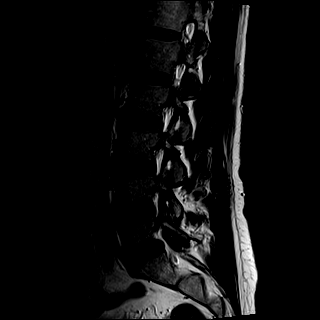
[im 14/17]
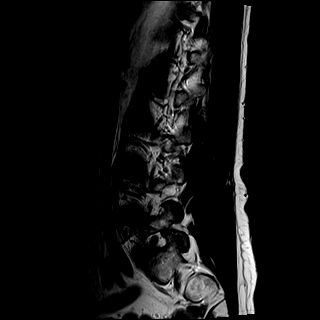
[im 15/17]
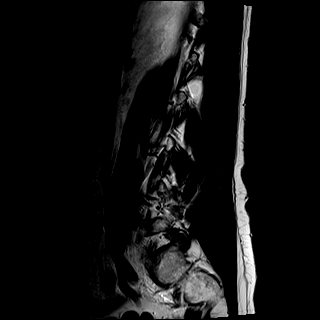
[im 17/17]
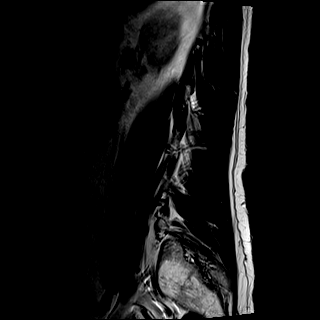

[Series 7: STIR · sagittal · 4.0mm · 0.41mm/px · 8 of 17 slices shown]
[im 1/17]
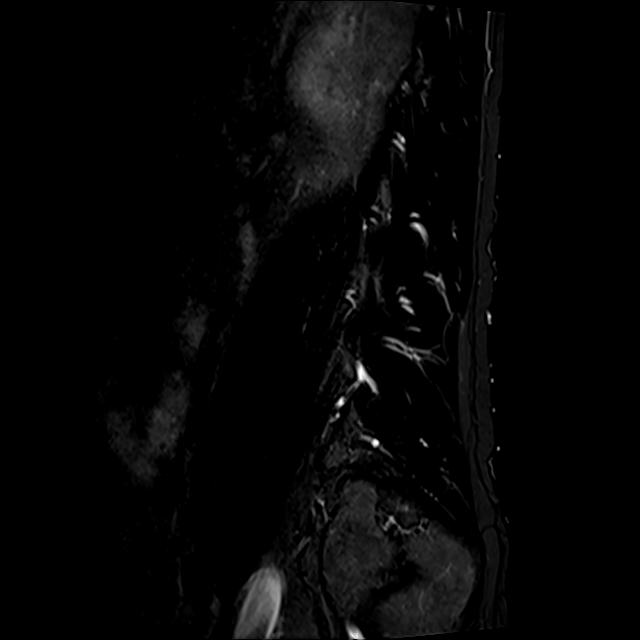
[im 2/17]
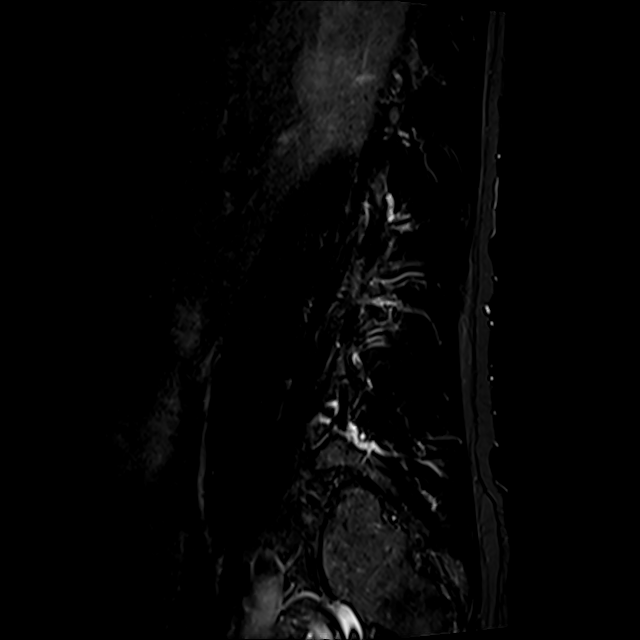
[im 4/17]
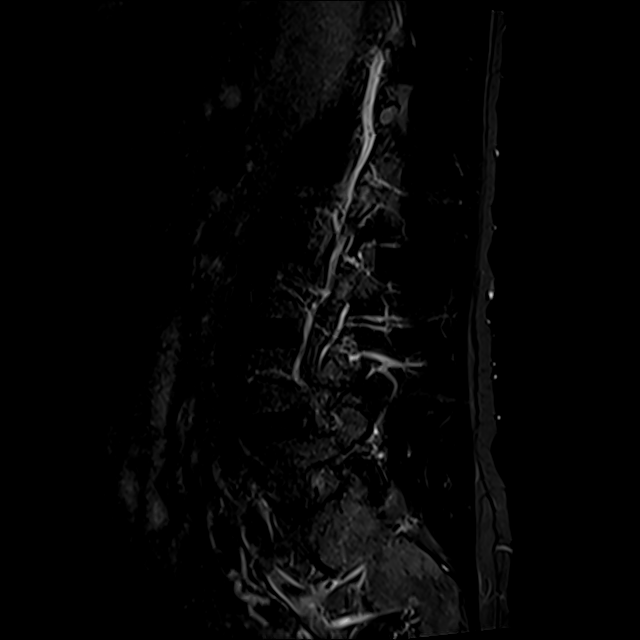
[im 5/17]
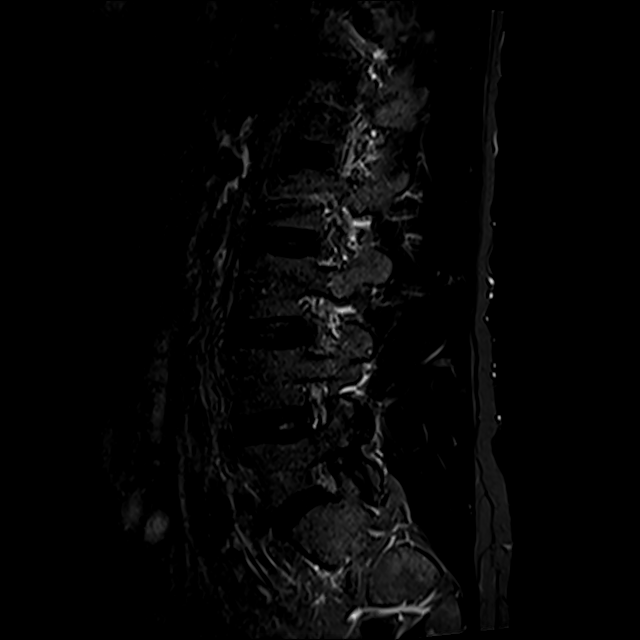
[im 7/17]
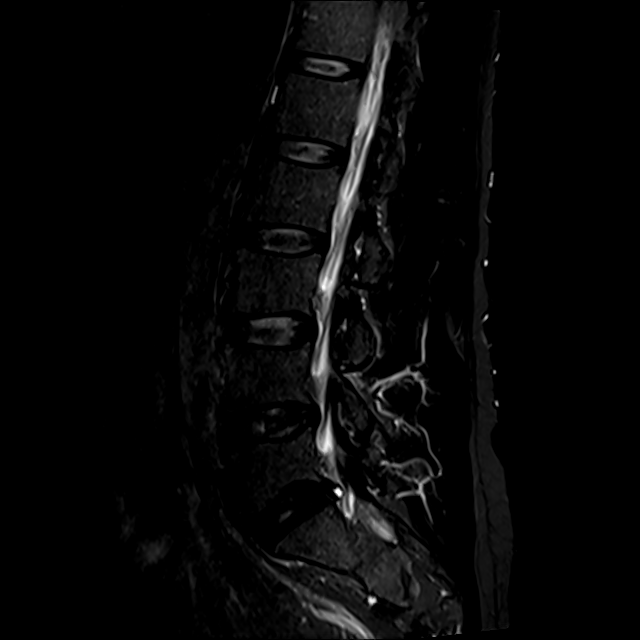
[im 9/17]
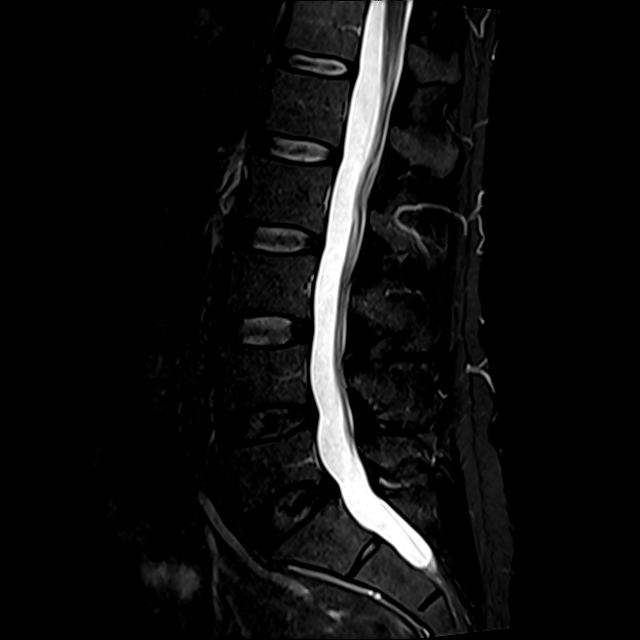
[im 10/17]
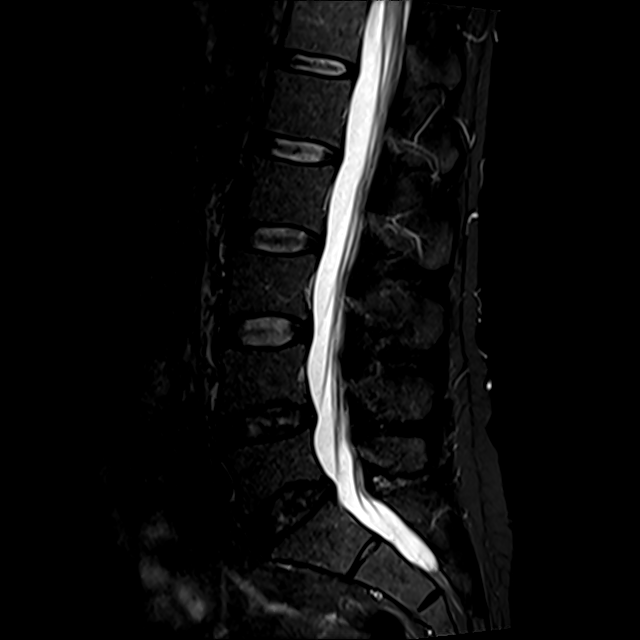
[im 15/17]
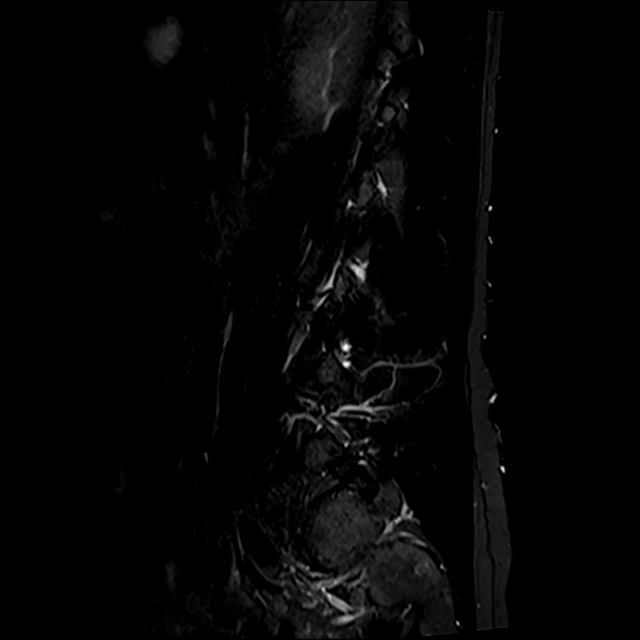

[Series 8: T2 · axial · 4.0mm · 0.78mm/px · z∈[-129,+73]mm · 9 of 38 slices shown (2 of 2)]
[im 2/38]
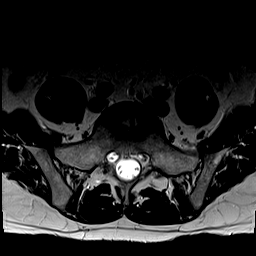
[im 7/38]
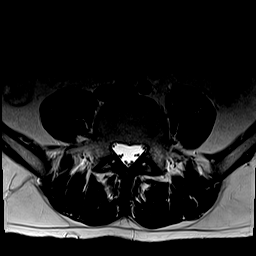
[im 11/38]
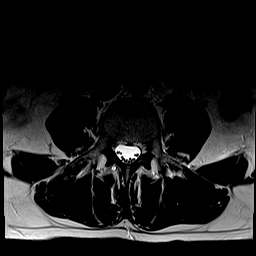
[im 16/38]
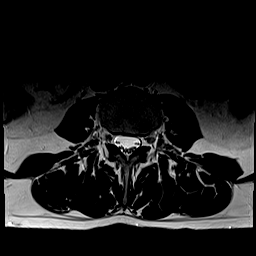
[im 19/38]
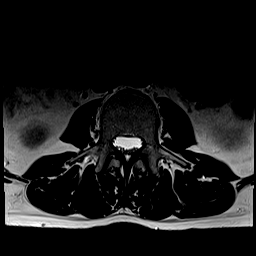
[im 22/38]
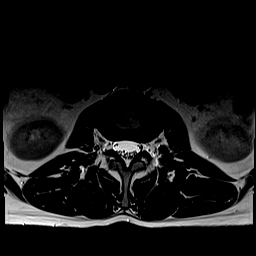
[im 27/38]
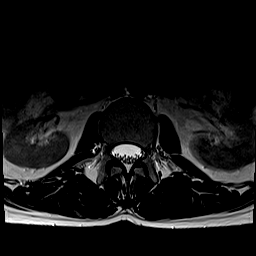
[im 31/38]
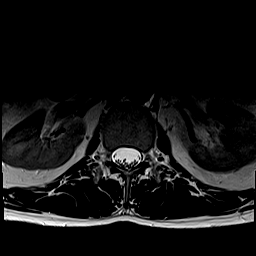
[im 36/38]
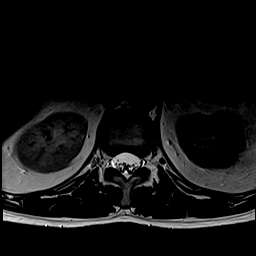

[26 of 48 positions shown; findings below may reference images not displayed]

FINDINGS: Segmentation:  Standard.

Alignment:  Physiologic.

Vertebrae:  No fracture, evidence of discitis, or bone lesion.

Conus medullaris and cauda equina: Conus extends to the T12-L1
level. Conus and cauda equina appear normal.

Paraspinal and other soft tissues: Negative.

Disc levels:

T12-L1:No spinal canal or neural foraminal stenosis.

L1-2:No spinal canal or neural foraminal stenosis.

L2-3:No spinal canal or neural foraminal stenosis.

L3-4:No spinal canal or neural foraminal stenosis.

L4-5:Small left foraminal/far lateral disc protrusion abutting the
exiting left L4 nerve root and resulting in mild left neural
foraminal narrowing. Mild facet degenerative changes. No significant
spinal canal stenosis. No significant change from prior.

L5-S1:Small central disc protrusion with left subarticular annular
tear, mild facet degenerative changes. Mild narrowing of the
bilateral subarticular zones, left greater than right.No significant
spinal canal or neural foraminal stenosis. No significant change
from prior.
IMPRESSION: 1. Small left foraminal/far lateral disc protrusion at L4-5 abutting
the exiting left L4 nerve root.
2. Left central/subarticular disc protrusion at L5-S1 with mild
bilateral narrowing of the subarticular zones, left greater than
right.
